# Patient Record
Sex: Female | Born: 1970
Health system: Southern US, Community
[De-identification: ages and names within clinical notes are randomized; demographics above are authoritative.]

## PROBLEM LIST (undated history)

## (undated) DIAGNOSIS — K219 Gastro-esophageal reflux disease without esophagitis: Secondary | ICD-10-CM

## (undated) DIAGNOSIS — I10 Essential (primary) hypertension: Secondary | ICD-10-CM

## (undated) DIAGNOSIS — Z9889 Other specified postprocedural states: Secondary | ICD-10-CM

## (undated) HISTORY — PX: OTHER SURGICAL HISTORY: SHX169

## (undated) HISTORY — PX: BACK SURGERY: SHX140

---

## 1996-10-20 HISTORY — PX: BACK SURGERY: SHX140

## 2014-04-20 ENCOUNTER — Encounter: Payer: Self-pay | Admitting: Podiatrist

## 2014-04-20 ENCOUNTER — Ambulatory Visit (INDEPENDENT_AMBULATORY_CARE_PROVIDER_SITE_OTHER): Payer: BC Managed Care – PPO | Admitting: Podiatrist

## 2014-04-20 ENCOUNTER — Ambulatory Visit: Payer: BC Managed Care – PPO

## 2014-04-20 ENCOUNTER — Ambulatory Visit (INDEPENDENT_AMBULATORY_CARE_PROVIDER_SITE_OTHER): Payer: BC Managed Care – PPO

## 2014-04-20 VITALS — BP 114/61 | HR 89 | Resp 16 | Ht 67.0 in | Wt 175.0 lb

## 2014-04-20 DIAGNOSIS — M779 Enthesopathy, unspecified: Secondary | ICD-10-CM

## 2014-04-20 DIAGNOSIS — M898X9 Other specified disorders of bone, unspecified site: Secondary | ICD-10-CM

## 2014-04-20 NOTE — Patient Instructions (Signed)
Pre-Operative Instructions  Congratulations, you have decided to take an important step to improving your quality of life.  You can be assured that the doctors of [facility] will be with you every step of the way.  1. Plan to be at the surgery center/hospital at least 1 (one) hour prior to your scheduled time unless otherwise directed by the surgical center/hospital staff.  You must have a responsible adult accompany you, remain during the surgery and drive you home.  Make sure you have directions to the surgical center/hospital and know how to get there on time. 2. For hospital based surgery you will need to obtain a history and physical form from your family physician within 1 month prior to the date of surgery- we will give you a form for you primary physician.  3. We make every effort to accommodate the date you request for surgery.  There are however, times where surgery dates or times have to be moved.  We will contact you as soon as possible if a change in schedule is required.   4. No Aspirin/Ibuprofen for one week before surgery.  If you are on aspirin, any non-steroidal anti-inflammatory medications (Mobic, Aleve, Ibuprofen) you should stop taking it 7 days prior to your surgery.  You make take Tylenol  For pain prior to surgery.  5. Medications- If you are taking daily heart and blood pressure medications, seizure, reflux, allergy, asthma, anxiety, pain or diabetes medications, make sure the surgery center/hospital is aware before the day of surgery so they may notify you which medications to take or avoid the day of surgery. 6. No food or drink after midnight the night before surgery unless directed otherwise by surgical center/hospital staff. 7. No alcoholic beverages 24 hours prior to surgery.  No smoking 24 hours prior to or 24 hours after surgery. 8. Wear loose pants or shorts- loose enough to fit over bandages, boots, and casts. 9. No slip on shoes, sneakers are best. 10. Bring  your boot with you to the surgery center/hospital.  Also bring crutches or a walker if your physician has prescribed it for you.  If you do not have this equipment, it will be provided for you after surgery. 11. If you have not been contracted by the surgery center/hospital by the day before your surgery, call to confirm the date and time of your surgery. 12. Leave-time from work may vary depending on the type of surgery you have.  Appropriate arrangements should be made prior to surgery with your employer. 13. Prescriptions will be provided immediately following surgery by your doctor.  Have these filled as soon as possible after surgery and take the medication as directed. 14. Remove nail polish on the operative foot. 15. Wash the night before surgery.  The night before surgery wash the foot and leg well with the antibacterial soap provided and water paying special attention to beneath the toenails and in between the toes.  Rinse thoroughly with water and dry well with a towel.  Perform this wash unless told not to do so by your physician.  Enclosed: 1 Ice pack (please put in freezer the night before surgery)   1 Hibiclens skin cleaner   Pre-op Instructions  If you have any questions regarding the instructions, do not hesitate to call our office.  [place]: [address] [place], [place] [address] [phone]  [place]: [address] [place], [place] [address] [phone]  [place]: [address]  [place], [place] [address] [phone]  Dr. Richard   Tuchman DPM, Dr. Norman Regal DPM Dr. Richard Sikora DPM, Dr. M. Todd Hyatt DPM, Dr. Roshawnda Pecora DPM 

## 2014-04-20 NOTE — Progress Notes (Signed)
   Subjective:    Patient ID: Angela Osborn, female    DOB: 1970-10-28, 43 y.o.   MRN: [id number]  HPI Comments: Its my 2nd toe on my rt foot. It is painful. Its been like this for 2 months. Sometimes it gets worse. In the mornings its worse especially when standing in the shower. i have been taping it, ice, took mobic.  Foot Pain      Review of Systems  All other systems reviewed and are negative.      Objective:   Physical Exam Patient is awake, alert, and oriented x 3.  In no acute distress.  Vascular status is intact with palpable pedal pulses at 2/4 DP and PT bilateral and capillary refill time within normal limits. Neurological sensation is also intact bilaterally via Semmes Weinstein monofilament at 5/5 sites. Light touch, vibratory sensation, Achilles tendon reflex is intact. Dermatological exam reveals skin color, turger and texture as normal. No open lesions present.  Musculature intact with dorsiflexion, plantarflexion, inversion, eversion.  Discrete pain on the medial aspect of the right second toe and plantar second toe is noted. It is increased from the last visit and 2012. X-ray show a exostosis growing on the medial aspect of the right second toe in the area of the pain.     Assessment & Plan:  Bony exostosis medial aspect right second toe  Plan: Recommended excision of the bony spur. We discussed the consent form the patient had ample time to ask questions. We will [facility] on outpatient basis. She'll be seen one week postoperatively. She was dispensed a Darco shoe today.

## 2014-05-03 DIAGNOSIS — M898X9 Other specified disorders of bone, unspecified site: Secondary | ICD-10-CM

## 2014-05-04 ENCOUNTER — Telehealth: Payer: Self-pay

## 2014-05-04 NOTE — Telephone Encounter (Signed)
Spoke with pt regarding post operative status. She stated that she was managing pain effectively. Advised to keep elevated and keep dressing dry and remain in boot until appt next week.

## 2014-05-11 ENCOUNTER — Ambulatory Visit (INDEPENDENT_AMBULATORY_CARE_PROVIDER_SITE_OTHER): Payer: BC Managed Care – PPO | Admitting: Podiatrist

## 2014-05-11 ENCOUNTER — Ambulatory Visit (INDEPENDENT_AMBULATORY_CARE_PROVIDER_SITE_OTHER): Payer: BC Managed Care – PPO

## 2014-05-11 VITALS — BP 123/77 | HR 69 | Resp 16

## 2014-05-11 DIAGNOSIS — Z9889 Other specified postprocedural states: Secondary | ICD-10-CM

## 2014-05-11 DIAGNOSIS — M898X9 Other specified disorders of bone, unspecified site: Secondary | ICD-10-CM

## 2014-05-11 NOTE — Progress Notes (Signed)
Subjective: Patient presents today1 week status post foot surgery of the right foot.  Date of surgery 05-03-2014-  Removal of bone spur on the medial side of the 2nd toe right foot. Patient denies nausea, vomiting, fevers, chills or night sweats.  Denies calf pain or tenderness to the operative side. Relates she is having some nerve pain on the foot and the right great toe.  Relates she has had problems in general with the right foot since her back surgery at 43 years old and she has no feeling on the outside of her leg.  Overall however, she is doing well post operatively  Objective:  Incision sites are well coapted with suture line in place.  No swelling, no redness is noted.  Minimal ecchymosis is noted.  Overall excellent appearance clinically and radiographically is noted.  Neurovascular status is intact with palpable pedal pulses DP and PT bilateral at 2+ out of 4. . Excellent appearance of the postoperative foot is noted.   Assessment: Status post excision of bone spur/ tumor right 2nd toe.  Plan:  Redressed the toe and a dry sterile and compressive dressing. Recommended that she keep it dry until Sunday then she can get the foot wet. I'll see her back next week and we will remove the sutures at that point. If any problems arise in the meantime she is instructed to call.

## 2014-05-18 ENCOUNTER — Ambulatory Visit (INDEPENDENT_AMBULATORY_CARE_PROVIDER_SITE_OTHER): Payer: BC Managed Care – PPO | Admitting: Podiatrist

## 2014-05-18 VITALS — BP 116/67 | HR 83 | Resp 16

## 2014-05-18 DIAGNOSIS — Z9889 Other specified postprocedural states: Secondary | ICD-10-CM

## 2014-05-18 DIAGNOSIS — M898X9 Other specified disorders of bone, unspecified site: Secondary | ICD-10-CM

## 2014-05-19 NOTE — Progress Notes (Signed)
Subjective: Angela Osborn presents today for HER-2 week postop followup status post removal exostosis medial aspect right second toe. She states she still has pain on the bottom of the toe at the base. Otherwise she's doing well.  Objective: Neurovascular status intact and unchanged incision site is well coapted in sutures are removed at today's visit. Slight irritation along the proximal portion of the incision is noted no redness, no streaking or sign of infection is noted. Overall excellent appearance is seen.  Assessment: 2 week status post excision of exostosis second digit right foot  Plan: Sutures are removed at today's visit and redressed with a dry dressing. She may begin to wear a normal shoes that are comfortable. I will see her back in 3 weeks for followup.

## 2014-06-01 ENCOUNTER — Ambulatory Visit (INDEPENDENT_AMBULATORY_CARE_PROVIDER_SITE_OTHER): Payer: BC Managed Care – PPO

## 2014-06-01 ENCOUNTER — Ambulatory Visit (INDEPENDENT_AMBULATORY_CARE_PROVIDER_SITE_OTHER): Payer: BC Managed Care – PPO | Admitting: Podiatrist

## 2014-06-01 VITALS — BP 137/84 | HR 81 | Resp 16

## 2014-06-01 DIAGNOSIS — M7750 Other enthesopathy of unspecified foot: Secondary | ICD-10-CM

## 2014-06-01 DIAGNOSIS — M898X9 Other specified disorders of bone, unspecified site: Secondary | ICD-10-CM

## 2014-06-01 DIAGNOSIS — M7741 Metatarsalgia, right foot: Secondary | ICD-10-CM

## 2014-06-01 DIAGNOSIS — M659 Synovitis and tenosynovitis, unspecified: Secondary | ICD-10-CM

## 2014-06-01 NOTE — Progress Notes (Signed)
Subjective: Angela Osborn presents today for her postoperative followup regarding excision of benign bone tumor medial aspect of the right second toe. She states over well she's doing well however she's continuing to compensate and has pain at the metatarsal area of the right foot. She has been wearing her good tennis shoes   Objective: Neurovascular status intact and unchanged. Incision site is healed. Swelling is still present along the medial aspect of the right second toe. Diffuse pain at the metatarsal heads 2 and 3 right are noted. High arched foot type is also noted  Assessment: Status post foot surgery, metatarsalgia  Plan: Recommended scanning her for orthotics at today's visit as I feel this would help reduce the pressure on the metatarsal heads and give her more comfortable gait. I will see her back for followup of the toe and [facility] in 4 weeks. She will pick up her orthotics when they're ready from the [place] location. She will call if any problems or concerns arise.

## 2014-06-22 ENCOUNTER — Encounter: Payer: Self-pay | Admitting: Podiatrist

## 2014-06-30 ENCOUNTER — Encounter: Payer: Self-pay | Admitting: Podiatrist

## 2014-06-30 ENCOUNTER — Ambulatory Visit (INDEPENDENT_AMBULATORY_CARE_PROVIDER_SITE_OTHER): Payer: BC Managed Care – PPO

## 2014-06-30 ENCOUNTER — Ambulatory Visit (INDEPENDENT_AMBULATORY_CARE_PROVIDER_SITE_OTHER): Payer: BC Managed Care – PPO | Admitting: Podiatrist

## 2014-06-30 VITALS — BP 121/81 | HR 86 | Resp 13

## 2014-06-30 DIAGNOSIS — M898X9 Other specified disorders of bone, unspecified site: Secondary | ICD-10-CM

## 2014-06-30 DIAGNOSIS — Z9889 Other specified postprocedural states: Secondary | ICD-10-CM

## 2014-06-30 NOTE — Patient Instructions (Signed)

## 2014-06-30 NOTE — Progress Notes (Signed)
Right 2nd toe looks good-- right hallux nail etched but OK, dispensed orthotics.   Subjective: Angela Osborn presents today for her postoperative followup regarding excision of benign bone tumor medial aspect of the right second toe. She states over well she's doing well however when she goes up and downstairs she has some pain in the metatarsal region of her right foot.  Her orthotics are being dispensed today and She has been wearing her good tennis shoes   Objective: Neurovascular status intact and unchanged. Incision site is healed. Indeed Swelling is still present along the medial aspect of the right second toe. Diffuse pain at the metatarsal heads 2 and 3 right are noted. High arched foot type is also noted  Orthotics appear to contour to the arch well. Itching on the dorsal aspect of the right hallux is noted likely from the long-term use of nail polish. It does not appear to be infected fungal  Assessment: Status post foot surgery, metatarsalgia   Plan: Orthotics are dispensed at today's visit. Recommended wearing these for at least 2 weeks to get adjusted to them until they're comfortable. I will see her back in 4 weeks for followup if orthotics require adjustment she'll bring them at that time.

## 2014-08-02 ENCOUNTER — Ambulatory Visit (INDEPENDENT_AMBULATORY_CARE_PROVIDER_SITE_OTHER): Payer: BC Managed Care – PPO | Admitting: Podiatrist

## 2014-08-02 ENCOUNTER — Encounter: Payer: Self-pay | Admitting: Podiatrist

## 2014-08-02 VITALS — BP 137/88 | HR 88 | Resp 16

## 2014-08-02 DIAGNOSIS — B351 Tinea unguium: Secondary | ICD-10-CM

## 2014-08-02 MED ORDER — EFINACONAZOLE 10 % EX SOLN: 1.0000 [drp] | CUTANEOUS | Status: DC

## 2014-08-07 NOTE — Progress Notes (Signed)
Subjective: Angela Osborn presents today for followup of right foot surgery in for followup of orthotics. She states her right foot is improving and she's noticed the orthotics are helping as well. She does state however, the left orthotic pad is too far forward and she can't get the foot comfortable on the pad. She wonders if moving the pad back somewhat help.  Objective: Neurovascular status is intact and unchanged. Excellent appearance of the right foot is noted in the incision site on the right foot is healed completely. Swelling is resolved. Overall pain has decreased on the right foot. Orthotic on the left has a small metatarsal pad that is too far forward. It is also too bulky for the left foot.  Status post right foot surgery  Plan: She will bring the orthotics in to the [facility] office on Monday and we will have them sent off to be adjusted. If the pad is still uncomfortable after that we will have it removed completely. As far as postoperatively on the right foot she is discharged. We will see her back in the future as necessary.

## 2014-08-13 DIAGNOSIS — K602 Anal fissure, unspecified: Secondary | ICD-10-CM | POA: Insufficient documentation

## 2014-08-13 DIAGNOSIS — K649 Unspecified hemorrhoids: Secondary | ICD-10-CM | POA: Insufficient documentation

## 2014-08-17 ENCOUNTER — Telehealth: Payer: Self-pay | Admitting: *Deleted

## 2014-08-17 NOTE — Telephone Encounter (Signed)
I'm a patient of Dr. Irving ShowsEgerton.  I had surgery, on the 15th of July and everything is going well with that, on my right foot 2nd toe.  My big toe she started treating it with Jublia.  We thought it had a fungus or something.  I found out last night as I was looking at it that it has come off.  The whole entire toenail, there's probably about a quarter of it holding on.  Give me a call back and let me know what I need to do.  I didn't treat it with the medicine this morning, didn't really see a point in treating it with the medicine.  Anyway, give me a call back.  I'd greatly appreciate it.  Thank you.  I called and informed her she needs to come in for an appointment to see Dr. Irving ShowsEgerton to have the nail removed.  "I came in there a few weeks ago and they took a sample of my nail and sent it off I think.  Did they hear anything about that?"  I told her we did send it off but have not received the results.  "I have a bandaid covering it so it will not catch on to anything.  Who do I need to talk to to schedule an appointment?"  I transferred her to [place].

## 2014-08-18 ENCOUNTER — Ambulatory Visit (INDEPENDENT_AMBULATORY_CARE_PROVIDER_SITE_OTHER): Payer: BC Managed Care – PPO | Admitting: Podiatrist

## 2014-08-18 ENCOUNTER — Encounter: Payer: Self-pay | Admitting: Podiatrist

## 2014-08-18 VITALS — BP 125/82 | HR 79 | Resp 13

## 2014-08-18 DIAGNOSIS — L03031 Cellulitis of right toe: Secondary | ICD-10-CM

## 2014-08-18 DIAGNOSIS — S90121A Contusion of right lesser toe(s) without damage to nail, initial encounter: Secondary | ICD-10-CM

## 2014-08-18 NOTE — Patient Instructions (Addendum)

## 2014-08-22 NOTE — Progress Notes (Signed)
Subjective: Angela Osborn presents today for her right great toenail that has become significantly loose.  She has been using the Jublia on the toenail for fungus and states that it started to become loose.  She states only a small part of the nail is attached and it is uncomfortable.  She also states the orthotics have become more comfortable since adjusting to them more. She would like to hold off on any modification to them at this time.   Objective: Neurovascular status is intact and unchanged.  Right hallux nail is loose at the base.  Only the most lateral part of the nail is still attached.  No redness, swelling or signs of infection.  Right second toe is still looking good after surgery.  Assessment:  Contusion of toenail, mycotic toenail, paronychia  Plan:  Treatment options and alternatives were discussed. Recommended a nonpermanent removal of the injured toenail. Patient agreed. Skin was prepped with alcohol and a local injection of lidocaine and Marcaine plain was infiltrated to anesthetize the toe. The toe was then prepped with Betadine exsanguinated. The nail was then carefully liftted from the nail bed.  It was cleansed well with Betadine and hydrogen peroxide solution. Antibiotic ointment and a dressing was then applied and the patient was given instructions for aftercare.

## 2014-08-25 ENCOUNTER — Encounter: Payer: Self-pay | Admitting: Podiatrist

## 2015-05-09 ENCOUNTER — Ambulatory Visit: Payer: BC Managed Care – PPO | Admitting: Podiatry

## 2015-05-16 ENCOUNTER — Ambulatory Visit: Payer: BC Managed Care – PPO | Admitting: Podiatry

## 2015-05-23 ENCOUNTER — Encounter: Payer: Self-pay | Admitting: Podiatry

## 2015-05-23 ENCOUNTER — Ambulatory Visit (INDEPENDENT_AMBULATORY_CARE_PROVIDER_SITE_OTHER): Payer: BC Managed Care – PPO | Admitting: Podiatry

## 2015-05-23 VITALS — BP 116/75 | HR 72 | Resp 16

## 2015-05-23 DIAGNOSIS — L603 Nail dystrophy: Secondary | ICD-10-CM

## 2015-05-23 NOTE — Progress Notes (Signed)
She presents today concerned over discoloration of her hallux nail plate right foot. She states that not only is a discolored but seems to be raising up from the right distal corner. She denies any trauma to it and states that as he grew back as growing back normally and now has started to appear very similar to as it did prior to surgery.  Objective: Vital signs are stable she's alert and oriented 3. Pulses are palpable. Nail dystrophy distal medial aspect of the hallux right subungual debris and distal onychomycosis.  Assessment: Probable onychodystrophy Rule out onychomycosis.  Plan: Sample of the nail was taken today to be sent for pathologic evaluation.

## 2015-06-11 ENCOUNTER — Ambulatory Visit (INDEPENDENT_AMBULATORY_CARE_PROVIDER_SITE_OTHER): Payer: BC Managed Care – PPO | Admitting: Podiatry

## 2015-06-11 ENCOUNTER — Encounter: Payer: Self-pay | Admitting: Podiatry

## 2015-06-11 VITALS — BP 124/83 | HR 74 | Resp 16

## 2015-06-11 DIAGNOSIS — Z79899 Other long term (current) drug therapy: Secondary | ICD-10-CM | POA: Diagnosis not present

## 2015-06-11 DIAGNOSIS — L603 Nail dystrophy: Secondary | ICD-10-CM

## 2015-06-11 MED ORDER — ITRACONAZOLE 200 MG PO TABS: 1.0000 | ORAL_TABLET | Freq: Every day | ORAL | Status: DC

## 2015-06-11 NOTE — Progress Notes (Signed)
She presents today for a follow-up of her nail dystrophy and for her pathology results. She denies any changes in her past medical history medications allergies surgeries or social history.  Objective: Vital signs are stable she is alert and oriented 44 year old white female in no acute distress. Pulses are palpable bilateral. Nail plates are unchanged. Pathology reports do demonstrate saprophytic fungus.  Assessment: Onychomycosis.  Plan: We started her on Sporanox and a liver profile was performed. Should this come back abnormal I'll notify her immediately. Otherwise I will follow up with her in 1 month for another liver test  Arbutus Ped DPM

## 2015-06-11 NOTE — Patient Instructions (Signed)
Itraconazole capsules and tablets What is this medicine? ITRACONAZOLE (i tra KO na zole) is an antifungal medicine. It is used to treat certain kinds of fungal or yeast infections. This medicine may be used for other purposes; ask your health care provider or pharmacist if you have questions. COMMON BRAND NAME(S): ONMEL, Sporanox What should I tell my health care provider before I take this medicine? They need to know if you have any of these conditions: -heart disease, including angina or heart failure -history of irregular heartbeat -kidney disease or on dialysis -liver disease -lung disease -an unusual or allergic reaction to itraconazole, or other antifungal medicines, foods, dyes or preservatives -pregnant or trying to get pregnant -breast-feeding How should I use this medicine? Take this medicine by mouth with a full glass of water. Follow the directions on the prescription label. Take after eating a full meal. If you have a condition called achlorhydria, are taking H2-receptor antagonists or other gastric acid suppressors, you should take this medicine with a cola beverage. Take your doses at regular intervals. Do not take your medicine more often than directed. Do not stop taking except on your doctor's advice. Talk to your pediatrician regarding the use of this medicine in children. Special care may be needed. Overdosage: If you think you have taken too much of this medicine contact a poison control center or emergency room at once. NOTE: This medicine is only for you. Do not share this medicine with others. What if I miss a dose? If you miss a dose, take it as soon as you can. If it is almost time for your next dose, take only that dose. Do not take double or extra doses. What may interact with this medicine? Do not take this medicine with any of the following medications: -alfuzosin -cisapride -colchicine -ergot alkaloids like dihydroergotamine, ergonovine, ergotamine,  methylergonovine -methadone -nevirapine -pimozide -red yeast rice -sirolimus -some medicines for anxiety or sleep like alprazolam, clorazepate, flurazepam, midazolam, triazolam -some medicines for blood pressure like felodipine and nisoldipine -some medicines for cancer treatment like irinotecan -some medicines for cholesterol like atorvastatin, cerivastatin, lovastatin, simvastatin -some medicines for the heart like conivaptan, disopyramide, dofetilide, dronedarone, eplerenone, quinidine, ranolazine -some medicines for psychotic disturbances like lurasidone This medicine may also interact with the following medications: -alfentanil -antacids -antiviral medicines for HIV or AIDS -budesonide -buspirone -cilostazol -cyclosporine -digoxin -eletriptan -erythromycin -fentanyl -fluticasone -halofantrine -medicines for blood pressure like amlodipine and nifedipine -medicines for stomach problems like cimetidine, famotidine, omeprazole, lansoprazole -medicines for tuberculosis like isoniazid, INH, rifabutin, rifampin, rifapentine -medicines that treat or prevent blood clots like warfarin -methylprednisolone -other medicines for fungal infections -phenytoin, fosphenytoin -some medicines for diabetes -tacrolimus -trimetrexate This list may not describe all possible interactions. Give your health care provider a list of all the medicines, herbs, non-prescription drugs, or dietary supplements you use. Also tell them if you smoke, drink alcohol, or use illegal drugs. Some items may interact with your medicine. What should I watch for while using this medicine? Visit your doctor or health care professional for check ups. Tell your doctor or healthcare professional if your symptoms do not start to get better or if they get worse. Some fungal infections can take many weeks or months of treatment to cure. Avoid medicines for your stomach like antacids, anticholinergics, and acid blockers for at  least 2 hours after taking this medicine. You may get dizzy or blurred/double vision when taking this medicine. Do not drive, use machinery, or do anything that may be dangerous   until you know how the medicine affects you. Do not stand or sit up quickly, especially if you are an older patient. What side effects may I notice from receiving this medicine? Side effects that you should report to your doctor or health care professional as soon as possible: -allergic reactions like skin rash, itching or hives, swelling of the face, lips, or tongue -breathing problems -changes in hearing -cough up mucus -dark urine -fast, irregular heartbeat -general ill feeling or flu-like symptoms -light-colored stools -loss of appetite -nausea, vomiting -pain, tingling, numbness in the hands or feet -redness, blistering, peeling or loosening of the skin, including inside the mouth -right upper belly pain -sudden weight gain -swelling in feet, ankles, or legs -unusually weak or tired -yellowing of the eyes or skin Side effects that usually do not require medical attention (report to your doctor or health care professional if they continue or are bothersome): -blurred/double vision -diarrhea -dizziness -headache -stomach upset or bloating This list may not describe all possible side effects. Call your doctor for medical advice about side effects. You may report side effects to FDA at 1-800-FDA-1088. Where should I keep my medicine? Keep out of the reach of children. Store at room temperature between 15 and 25 degrees C (59 and 77 degrees F). Protect from light and moisture. Throw away any unused medicine after the expiration date. NOTE: This sheet is a summary. It may not cover all possible information. If you have questions about this medicine, talk to your doctor, pharmacist, or health care provider.  2015, Elsevier/[employer] Standard. (2013-04-18 17:02:46)  

## 2015-06-12 LAB — CBC WITH DIFFERENTIAL/PLATELET
BASOS ABS: 0 10*3/uL (ref 0.0–0.2)
Basos: 0 %
EOS (ABSOLUTE): 0.1 10*3/uL (ref 0.0–0.4)
Eos: 2 %
HEMATOCRIT: 36.1 % (ref 34.0–46.6)
Hemoglobin: 12.3 g/dL (ref 11.1–15.9)
IMMATURE GRANS (ABS): 0 10*3/uL (ref 0.0–0.1)
IMMATURE GRANULOCYTES: 0 %
LYMPHS: 42 %
Lymphocytes Absolute: 2.5 10*3/uL (ref 0.7–3.1)
MCH: 29.5 pg (ref 26.6–33.0)
MCHC: 34.1 g/dL (ref 31.5–35.7)
MCV: 87 fL (ref 79–97)
MONOCYTES: 8 %
Monocytes Absolute: 0.5 10*3/uL (ref 0.1–0.9)
Neutrophils Absolute: 2.8 10*3/uL (ref 1.4–7.0)
Neutrophils: 48 %
Platelets: 265 10*3/uL (ref 150–379)
RBC: 4.17 x10E6/uL (ref 3.77–5.28)
RDW: 14.2 % (ref 12.3–15.4)
WBC: 5.8 10*3/uL (ref 3.4–10.8)

## 2015-06-12 LAB — HEPATIC FUNCTION PANEL
ALT: 15 IU/L (ref 0–32)
AST: 18 IU/L (ref 0–40)
Albumin: 4.4 g/dL (ref 3.5–5.5)
Alkaline Phosphatase: 66 IU/L (ref 39–117)
Bilirubin Total: 0.2 mg/dL (ref 0.0–1.2)
Bilirubin, Direct: 0.08 mg/dL (ref 0.00–0.40)
TOTAL PROTEIN: 6.6 g/dL (ref 6.0–8.5)

## 2015-06-14 ENCOUNTER — Telehealth: Payer: Self-pay

## 2015-06-14 NOTE — Telephone Encounter (Signed)
Pt called regarding Rx prescription. Stated that she had not been contacted from [facility]. Called pt back and left vm for her to call the pharmacy and inquire about Rx status.

## 2015-07-10 ENCOUNTER — Telehealth: Payer: Self-pay | Admitting: *Deleted

## 2015-07-10 NOTE — Telephone Encounter (Signed)
Pt asked what she will be coming in for on Wednesday.  I told her lab blood work and to discuss if there were possibly any side effects and to discuss possible out come.

## 2015-07-11 ENCOUNTER — Encounter: Payer: Self-pay | Admitting: Podiatry

## 2015-07-11 ENCOUNTER — Ambulatory Visit (INDEPENDENT_AMBULATORY_CARE_PROVIDER_SITE_OTHER): Payer: BC Managed Care – PPO | Admitting: Podiatry

## 2015-07-11 VITALS — BP 124/88 | HR 82 | Resp 18

## 2015-07-11 DIAGNOSIS — B351 Tinea unguium: Secondary | ICD-10-CM | POA: Diagnosis not present

## 2015-07-11 DIAGNOSIS — Z79899 Other long term (current) drug therapy: Secondary | ICD-10-CM | POA: Diagnosis not present

## 2015-07-11 NOTE — Progress Notes (Signed)
She presents today for follow-up of her onychomycosis. She is currently using itraconazole. She denies fever chills nausea vomiting muscle aches and pains rashes or itching.  Objective: No change in her nail plates as of yet.  Assessment: Long-term use of itraconazole for onychomycosis.  Plan: We requested another liver profile today and will follow-up with her in 3 months. She will call with complications or problems regarding her medication.

## 2015-07-12 ENCOUNTER — Telehealth: Payer: Self-pay | Admitting: *Deleted

## 2015-07-12 LAB — HEPATIC FUNCTION PANEL
ALBUMIN: 4.4 g/dL (ref 3.5–5.5)
ALT: 13 IU/L (ref 0–32)
AST: 14 IU/L (ref 0–40)
Alkaline Phosphatase: 74 IU/L (ref 39–117)
Bilirubin, Direct: 0.07 mg/dL (ref 0.00–0.40)
TOTAL PROTEIN: 6.8 g/dL (ref 6.0–8.5)

## 2015-07-12 NOTE — Telephone Encounter (Addendum)
-----   Message from Elinor Parkinson, [place] sent at 07/12/2015  7:49 AM EDT ----- Blood work looks great.  Levels are unchanged and may continue medication.  Informed pt of Dr. Geryl Rankins orders.

## 2015-10-09 ENCOUNTER — Encounter: Payer: Self-pay | Admitting: Podiatry

## 2015-10-10 ENCOUNTER — Encounter: Payer: Self-pay | Admitting: Podiatry

## 2015-10-10 ENCOUNTER — Ambulatory Visit (INDEPENDENT_AMBULATORY_CARE_PROVIDER_SITE_OTHER): Payer: BC Managed Care – PPO | Admitting: Podiatry

## 2015-10-10 VITALS — BP 133/90 | HR 95 | Resp 16

## 2015-10-10 DIAGNOSIS — Z79899 Other long term (current) drug therapy: Secondary | ICD-10-CM | POA: Diagnosis not present

## 2015-10-10 DIAGNOSIS — B351 Tinea unguium: Secondary | ICD-10-CM

## 2015-10-10 MED ORDER — TERBINAFINE HCL 250 MG PO TABS: 250.0000 mg | ORAL_TABLET | Freq: Every day | ORAL | Status: DC

## 2015-10-10 NOTE — Progress Notes (Signed)
She presents today for follow-up of her Sporanox therapy. She states that it has not affected her nail.  Objective: Vital signs stable she is alert and oriented 3 pulses are palpable. No change in the nail plate yet.  Assessment: Onychomycosis not responding to Sporanox.  Plan: Start her on Lamisil 250 mg tablets 1 by mouth daily 30 will follow up with her in 1 month.

## 2015-11-06 ENCOUNTER — Telehealth: Payer: Self-pay | Admitting: *Deleted

## 2015-11-06 ENCOUNTER — Other Ambulatory Visit: Payer: Self-pay | Admitting: Podiatry

## 2015-11-06 NOTE — Telephone Encounter (Signed)
Our office received a refill request for Terbinafine.  Pt was instructed at last visit to make an appt to be seen after 30 doses of the medication.  Contacted pt and informed her of the refill request and she said she felt it may have been an automative request because she is scheduled to see Dr. Al Corpus prior to refills.  I told pt BRAVO, and to keep her appt.

## 2015-11-14 ENCOUNTER — Ambulatory Visit: Payer: BC Managed Care – PPO | Admitting: Podiatry

## 2015-11-19 ENCOUNTER — Encounter: Payer: Self-pay | Admitting: Podiatry

## 2015-11-19 ENCOUNTER — Ambulatory Visit (INDEPENDENT_AMBULATORY_CARE_PROVIDER_SITE_OTHER): Payer: BC Managed Care – PPO | Admitting: Podiatry

## 2015-11-19 DIAGNOSIS — M549 Dorsalgia, unspecified: Secondary | ICD-10-CM | POA: Insufficient documentation

## 2015-11-19 DIAGNOSIS — B351 Tinea unguium: Secondary | ICD-10-CM | POA: Diagnosis not present

## 2015-11-19 DIAGNOSIS — Z79899 Other long term (current) drug therapy: Secondary | ICD-10-CM | POA: Diagnosis not present

## 2015-11-19 DIAGNOSIS — J309 Allergic rhinitis, unspecified: Secondary | ICD-10-CM | POA: Insufficient documentation

## 2015-11-19 DIAGNOSIS — K219 Gastro-esophageal reflux disease without esophagitis: Secondary | ICD-10-CM | POA: Insufficient documentation

## 2015-11-19 MED ORDER — TERBINAFINE HCL 250 MG PO TABS: 250.0000 mg | ORAL_TABLET | Freq: Every day | ORAL | Status: DC

## 2015-11-19 NOTE — Progress Notes (Signed)
She presents today after taking 30 days of Lamisil. She denies fever chills nausea vomiting muscle aches and pains itching or rashes. She states that is growing very slowly she refers to the nail plate hallux left.  Objective: Vital signs are stable she is alert and oriented 3. Nail plate does not demonstrate any change as of yet. As a matter fact it looks more dystrophic.  Assessment: Long-term therapy with Lamisil secondary to nail dystrophy and onychomycosis.  Plan: Continue another 90 days Lamisil requested another blood work. Follow up with her should this blood work back abnormal. Otherwise I will follow up with her 4 months

## 2015-11-21 ENCOUNTER — Telehealth: Payer: Self-pay | Admitting: *Deleted

## 2015-11-21 LAB — HEPATIC FUNCTION PANEL
ALT: 20 IU/L (ref 0–32)
AST: 17 IU/L (ref 0–40)
Albumin: 4.3 g/dL (ref 3.5–5.5)
Alkaline Phosphatase: 66 IU/L (ref 39–117)
Bilirubin, Direct: 0.07 mg/dL (ref 0.00–0.40)
Total Protein: 6.4 g/dL (ref 6.0–8.5)

## 2015-11-21 NOTE — Telephone Encounter (Addendum)
-----   Message from Elinor Parkinson, [place] sent at 11/21/2015  7:55 AM EST ----- Blood work looks good and may continue medication.  Informed pt of orders.

## 2015-11-27 ENCOUNTER — Ambulatory Visit (INDEPENDENT_AMBULATORY_CARE_PROVIDER_SITE_OTHER): Payer: BC Managed Care – PPO | Admitting: Podiatry

## 2015-11-27 ENCOUNTER — Encounter: Payer: Self-pay | Admitting: Podiatry

## 2015-11-27 ENCOUNTER — Ambulatory Visit (INDEPENDENT_AMBULATORY_CARE_PROVIDER_SITE_OTHER): Payer: BC Managed Care – PPO

## 2015-11-27 VITALS — BP 125/83 | HR 80 | Resp 18

## 2015-11-27 DIAGNOSIS — R52 Pain, unspecified: Secondary | ICD-10-CM

## 2015-11-27 DIAGNOSIS — M795 Residual foreign body in soft tissue: Secondary | ICD-10-CM

## 2015-11-27 DIAGNOSIS — B351 Tinea unguium: Secondary | ICD-10-CM | POA: Diagnosis not present

## 2015-11-27 MED ORDER — CEPHALEXIN 500 MG PO CAPS: 500.0000 mg | ORAL_CAPSULE | Freq: Three times a day (TID) | ORAL | Status: DC

## 2015-11-27 NOTE — Patient Instructions (Signed)

## 2015-11-30 DIAGNOSIS — M795 Residual foreign body in soft tissue: Secondary | ICD-10-CM | POA: Insufficient documentation

## 2015-11-30 DIAGNOSIS — B351 Tinea unguium: Secondary | ICD-10-CM | POA: Insufficient documentation

## 2015-11-30 NOTE — Progress Notes (Signed)
Patient ID: Angela Osborn, female   DOB: 12/06/1970, 45 y.o.   MRN: [id number]  Subjective: 45 year old female presents the office today for concerns of a possible hair in the ball of her left foot which has been ongoing the last couple days. She states that she try to sterilize a needle at home and charted cut the area out however she is unable to do so. She believes that she stepped on a hair while in the bathroom and since then she has had discomfort this area. Denies any swelling redness or drainage but is somewhat swollen. She is currently also on Lamisil for her right big toenail without any side effects. Denies any systemic complaints such as fevers, chills, nausea, vomiting. No acute changes since last appointment, and no other complaints at this time.   Objective: AAO x3, NAD DP/PT pulses palpable bilaterally, CRT less than 3 seconds Protective sensation intact with Simms Weinstein monofilament, vibratory sensation intact, Achilles tendon reflex intact On the plantar aspect of the left submetatarsal area is a pinpoint area of tenderness overlying a possible foreign body. Upon debridement a small black care was removed. Upon further debridement there is resolution of symptoms. There is no drainage or pus. There is mild localized edema to the area without any significant erythema or increase in warmth. There is no drainage or pus. No other areas of tenderness bilateral lower extremities. The right hallux nails hypertrophic, dystrophic, discolored. She is coming on Lamisil. No swelling erythema or drainage. No pain. No areas of pinpoint bony tenderness or pain with vibratory sensation. MMT 5/5, ROM WNL. No edema, erythema, increase in warmth to bilateral lower extremities.  No open lesions or pre-ulcerative lesions.  No pain with calf compression, swelling, warmth, erythema  Assessment: 45 year old female left foot foreign body/hair, onychomycosis right hallux  Plan: -All treatment options  discussed with the patient including all alternatives, risks, complications.  -X-rays were obtained. There is no evidence of foreign body present. No fracture. -Symptomatic area was debrided and a small hair was removed. Upon debridement there is resolution of symptoms. Due to the localized edema will start Keflex in case of early infection. Continue with Epsom salts soaks as well as Neosporin and a Band-Aid. Monitor for any clinical signs or symptoms of infection and directed to call the office immediately should any occur or go to the ER. -Continue Lamisil for right hallux toenail. -Follow-up as scheduled. -Patient encouraged to call the office with any questions, concerns, change in symptoms.   Ovid Curd, DPM

## 2016-03-07 ENCOUNTER — Other Ambulatory Visit: Payer: Self-pay | Admitting: Podiatry

## 2016-03-19 ENCOUNTER — Ambulatory Visit: Payer: BC Managed Care – PPO | Admitting: Podiatry

## 2016-08-25 DIAGNOSIS — Z0000 Encounter for general adult medical examination without abnormal findings: Secondary | ICD-10-CM | POA: Insufficient documentation

## 2016-12-23 DIAGNOSIS — G8929 Other chronic pain: Secondary | ICD-10-CM | POA: Insufficient documentation

## 2016-12-23 DIAGNOSIS — M545 Low back pain: Secondary | ICD-10-CM

## 2017-04-08 NOTE — Progress Notes (Signed)
Tawana ScaleZach Stanzione D.O. [facility] [address] [place], [place] [address] Phone: [phone] Subjective:     CC:  Low back pain.  FAO:[id number]:Subjective  Angela Osborn is a 46 y.o. female coming in with complaint of low back pain. Patient has had this for quite some time. Was seen another provider and had significant workup for this including MRI. Patient also had an epidural that did improve. Patient wants to avoid any type of other epidural but is unfortunately noticing having worsening back pain. Seems to stay localized. Worse with sitting for long amount of time. Denies any radiation down the legs, any numbness or tingling. Patient rates the severity of pain as 5 out of 10 but does have a constant dull aching pain. Sometimes seems to be worse with flexion and sometimes seems to be worse with extension. Sometimes can wake her up at night. Respond somewhat to over-the-counter medications. Denies any fevers, chills, any abnormal weight loss.    Patient has had imaging including MRI of the lumbar spine. MRI taken 01/27/2017 was independently visualized by me. Patient did have a moderate central disc extrusion at L5-S1 with facet arthritis. Otherwise appeared to be fairly unremarkable. No past medical history on file. No past surgical history on file. Social History   Social History  . Marital status: Married    Spouse name: N/A  . Number of children: N/A  . Years of education: N/A   Social History Main Topics  . Smoking status: Never Smoker  . Smokeless tobacco: Not on file  . Alcohol use No  . Drug use: Unknown  . Sexual activity: Not on file   Other Topics Concern  . Not on file   Social History Narrative  . No narrative on file   Allergies  Allergen Reactions  . Ciprofloxacin Nausea Only  . Sulfa Antibiotics Nausea Only   No family history on file.  Past medical history, social, surgical and family history all reviewed in electronic medical record.  No pertanent  information unless stated regarding to the chief complaint.   Review of Systems:Review of systems updated and as accurate as of 04/08/17  No headache, visual changes, nausea, vomiting, diarrhea, constipation, dizziness, abdominal pain, skin rash, fevers, chills, night sweats, weight loss, swollen lymph nodes, body aches, joint swelling,, chest pain, shortness of breath, mood changes.  Positive muscle aches  Objective  There were no vitals taken for this visit. Systems examined below as of 04/08/17   General: No apparent distress alert and oriented x3 mood and affect normal, dressed appropriately.  HEENT: Pupils equal, extraocular movements intact  Respiratory: Patient's speak in full sentences and does not appear short of breath  Cardiovascular: No lower extremity edema, non tender, no erythema  Skin: Warm dry intact with no signs of infection or rash on extremities or on axial skeleton.  Abdomen: Soft nontender  Neuro: Cranial nerves II through XII are intact, neurovascularly intact in all extremities with 2+ DTRs and 2+ pulses.  Lymph: No lymphadenopathy of posterior or anterior cervical chain or axillae bilaterally.  Gait normal with good balance and coordination.  MSK:  Non tender with full range of motion and good stability and symmetric strength and tone of shoulders, elbows, wrist, hip, knee and ankles bilaterally.  Back Exam:  Inspection: Unremarkable  Motion: Flexion 30 deg, Extension 15 deg, Side Bending to 25 deg bilaterally,  Rotation to 25 deg bilaterally  SLR laying: Negative  XSLR laying: Negative  Palpable tenderness: ender to  palpation in the paraspinal mus. FABER: mild tightness bilaterally. Sensory change: Gross sensation intact to all lumbar and sacral dermatomes.  Reflexes: 2+ at both patellar tendons, 2+ at achilles tendons, Babinski's downgoing.  Strength at foot  Plantar-flexion: 5/5 Dorsi-flexion: 5/5 Eversion: 5/5 Inversion: 5/5  Leg strength  Quad: 5/5  Hamstring: 5/5 Hip flexor: 5/5 Hip abductors: 4/5  Gait unremarkable.  Osteopathic findings T3 extended rotated and side bent right  T9 extended rotated and side bent left L2 flexed rotated and side bent right L5 flexed rotated and side bent left Sacrum right on right  Procedure note  97110; 15 minutes spent for Therapeutic exercises as stated in above notes.  This included exercises focusing on stretching, strengthening, with significant focus on eccentric aspects. Low back exercises that included:  Pelvic tilt/bracing instruction to focus on control of the pelvic girdle and lower abdominal muscles  Glute strengthening exercises, focusing on proper firing of the glutes without engaging the low back muscles Proper stretching techniques for maximum relief for the hamstrings, hip flexors, low back and some rotation where tolerated    Proper technique shown and discussed handout in great detail with ATC.  All questions were discussed and answered. \   Impression and Recommendations:     This case required medical decision making of moderate complexity.      Note: This dictation was prepared with Dragon dictation along with smaller phrase technology. Any transcriptional errors that result from this process are unintentional.

## 2017-04-09 ENCOUNTER — Ambulatory Visit (INDEPENDENT_AMBULATORY_CARE_PROVIDER_SITE_OTHER): Payer: BC Managed Care – PPO | Admitting: Family Medicine

## 2017-04-09 DIAGNOSIS — M5136 Other intervertebral disc degeneration, lumbar region: Secondary | ICD-10-CM | POA: Diagnosis not present

## 2017-04-09 DIAGNOSIS — M999 Biomechanical lesion, unspecified: Secondary | ICD-10-CM | POA: Diagnosis not present

## 2017-04-09 DIAGNOSIS — M4696 Unspecified inflammatory spondylopathy, lumbar region: Secondary | ICD-10-CM

## 2017-04-09 DIAGNOSIS — M5137 Other intervertebral disc degeneration, lumbosacral region: Secondary | ICD-10-CM | POA: Insufficient documentation

## 2017-04-09 DIAGNOSIS — M51379 Other intervertebral disc degeneration, lumbosacral region without mention of lumbar back pain or lower extremity pain: Secondary | ICD-10-CM

## 2017-04-09 DIAGNOSIS — M47816 Spondylosis without myelopathy or radiculopathy, lumbar region: Secondary | ICD-10-CM

## 2017-04-09 MED ORDER — VITAMIN D (ERGOCALCIFEROL) 1.25 MG (50000 UNIT) PO CAPS: 50000.0000 [IU] | ORAL_CAPSULE | ORAL | 0 refills | Status: DC

## 2017-04-09 NOTE — Assessment & Plan Note (Signed)
Decision today to treat with OMT was based on Physical Exam  After verbal consent patient was treated with HVLA, ME, FPR techniques in  thoracic, lumbar and sacral areas  Patient tolerated the procedure well with improvement in symptoms  Patient given exercises, stretches and lifestyle modifications  See medications in patient instructions if given  Patient will follow up in 4 weeks 

## 2017-04-09 NOTE — Assessment & Plan Note (Signed)
Facet arthritis noted at L5-S1. Worsening symptoms consider injections. Avoid excessive extension

## 2017-04-09 NOTE — Patient Instructions (Signed)
Good to see you  Ice 20 minutes 2 times daily. Usually after activity and before bed. Exercises 3 times a week.  Once weekly vitamin D for 12 weeks.  Continue your other vitamins Also get  Turmeric 500mg  twice daily  Tart cherry extract any dose at night You will do great  See me again in 4 weeks.

## 2017-04-09 NOTE — Assessment & Plan Note (Signed)
Significant degenerative disc disease of the L5-S1 region. Discussed with patient also about the facet arthritis. Has responded well to the epidural previously. Once to avoid another injection at this time. Started on once weekly vitamin D for muscle strength and endurance. Responded very well to osteopathic manipulation. Home exercises given again we discussed icing regimen. Follow-up again in 4 weeks.

## 2017-05-07 ENCOUNTER — Ambulatory Visit (INDEPENDENT_AMBULATORY_CARE_PROVIDER_SITE_OTHER): Payer: BC Managed Care – PPO | Admitting: Family Medicine

## 2017-05-07 VITALS — BP 124/84 | HR 76 | Ht 67.0 in | Wt 194.0 lb

## 2017-05-07 DIAGNOSIS — M4696 Unspecified inflammatory spondylopathy, lumbar region: Secondary | ICD-10-CM

## 2017-05-07 DIAGNOSIS — M47816 Spondylosis without myelopathy or radiculopathy, lumbar region: Secondary | ICD-10-CM

## 2017-05-07 DIAGNOSIS — M999 Biomechanical lesion, unspecified: Secondary | ICD-10-CM

## 2017-05-07 DIAGNOSIS — S76302A Unspecified injury of muscle, fascia and tendon of the posterior muscle group at thigh level, left thigh, initial encounter: Secondary | ICD-10-CM | POA: Insufficient documentation

## 2017-05-07 NOTE — Assessment & Plan Note (Signed)
Patient does have a hamstring injury. We discussed icing regimen and home exercises. Discussed compression. Follow-up again in 4 weeks

## 2017-05-07 NOTE — Assessment & Plan Note (Signed)
Patient significant arthritis at this level. Patient does not want have another epidural if possible. Maybe consider facet injections. Patient does have a new hamstring injury. We discussed with patient at great length. Patient will try to increase her daily. Follow-up again in 4 weeks

## 2017-05-07 NOTE — Patient Instructions (Signed)
Good to see you.  Ice 20 minutes 2 times daily. Usually after activity and before bed. New exercises 3 times a week  Thigh compression sleeve daily for 2 weeks can help a lot.  See me again in 4-6 weeks

## 2017-05-07 NOTE — Progress Notes (Signed)
Terrilee Files [facility] [address] [place], [place] [address] Phone: [phone] Subjective:     CC:  Low back pain f/u   BJY:[id number]  Angela Osborn is a 46 y.o. female coming in with complaint of low back pain. Patient has had this for quite some time. Was seen another provider and had significant workup for this including MRI. Patient also had an epidural that did improve. Patient wants to avoid any type of other epidural but is unfortunately noticing having worsening back pain.   Patient's only previously weaned and started osteopathic manipulation. Patient has been doing the home exercises. Patient states still having some discomfort. Recently did well. Having more pain in the ischial bursa. States that it seems to be more of a tightness of the left leg. Denies any weakness. States that it continues to give her difficulty though. Unable to work out as much as she would like. Can affect her work..    Patient has had imaging including MRI of the lumbar spine. MRI taken 01/27/2017 was independently visualized by me. Patient did have a moderate central disc extrusion at L5-S1 with facet arthritis. Otherwise appeared to be fairly unremarkable. No past medical history on file. No past surgical history on file. Social History   Social History  . Marital status: Married    Spouse name: N/A  . Number of children: N/A  . Years of education: N/A   Social History Main Topics  . Smoking status: Never Smoker  . Smokeless tobacco: Not on file  . Alcohol use No  . Drug use: Unknown  . Sexual activity: Not on file   Other Topics Concern  . Not on file   Social History Narrative  . No narrative on file   Allergies  Allergen Reactions  . Ciprofloxacin Nausea Only  . Sulfa Antibiotics Nausea Only   No family history on file.  Past medical history, social, surgical and family history all reviewed in electronic medical record.  No pertanent information unless  stated regarding to the chief complaint.   Review of Systems: No headache, visual changes, nausea, vomiting, diarrhea, constipation, dizziness, abdominal pain, skin rash, fevers, chills, night sweats, weight loss, swollen lymph nodes, body aches, joint swelling, muscle aches, chest pain, shortness of breath, mood changes.    Objective  Blood pressure 124/84, pulse 76, height 5\' 7"  (1.702 m), weight 194 lb (88 kg).   Systems examined below as of 05/07/17 General: NAD A&O x3 mood, affect normal  HEENT: Pupils equal, extraocular movements intact no nystagmus Respiratory: not short of breath at rest or with speaking Cardiovascular: No lower extremity edema, non tender Skin: Warm dry intact with no signs of infection or rash on extremities or on axial skeleton. Abdomen: Soft nontender, no masses Neuro: Cranial nerves  intact, neurovascularly intact in all extremities with 2+ DTRs and 2+ pulses. Lymph: No lymphadenopathy appreciated today  Gait normal with good balance and coordination.  MSK: Non tender with full range of motion and good stability and symmetric strength and tone of shoulders, elbows, wrist,  knee hips and ankles bilaterally.   Back Exam:  Inspection: Mild loss of lordosis Motion: Flexion 30 deg, Extension 15 deg, Side Bending to 25 deg bilaterally,  Rotation to 25 deg bilaterally  SLR laying: Negative significant tightness on the left hamstring XSLR laying: Negative  Palpable tenderness: Significant increased tenderness in the lumbosacral area. Sensory change: Gross sensation intact to all lumbar and sacral dermatomes.  Reflexes:  2+ at both patellar tendons, 2+ at achilles tendons, Babinski's downgoing.  Strength at foot  Plantar-flexion: 5/5 Dorsi-flexion: 5/5 Eversion: 5/5 Inversion: 5/5  Leg strength  Quad: 5/5 Hamstring: 5/5 Hip flexor: 5/5 Hip abductors: 4/5  Gait unremarkable.  Osteopathic findings C2 flexed rotated and side bent right C4 flexed rotated and side  bent left C7 flexed rotated and side bent left T3 extended rotated and side bent right inhaled third rib L2 flexed rotated and side bent right Sacrum right on right    Impression and Recommendations:     This case required medical decision making of moderate complexity.      Note: This dictation was prepared with Dragon dictation along with smaller phrase technology. Any transcriptional errors that result from this process are unintentional.

## 2017-05-07 NOTE — Assessment & Plan Note (Signed)
Decision today to treat with OMT was based on Physical Exam  After verbal consent patient was treated with HVLA, ME, FPR techniques in  thoracic, lumbar and sacral areas  Patient tolerated the procedure well with improvement in symptoms  Patient given exercises, stretches and lifestyle modifications  See medications in patient instructions if given  Patient will follow up in 4 weeks 

## 2017-06-10 ENCOUNTER — Ambulatory Visit: Payer: BC Managed Care – PPO | Admitting: Family Medicine

## 2017-06-29 ENCOUNTER — Other Ambulatory Visit: Payer: Self-pay | Admitting: Family Medicine

## 2017-06-29 NOTE — Telephone Encounter (Signed)
Refill done.  

## 2017-09-15 ENCOUNTER — Other Ambulatory Visit: Payer: Self-pay | Admitting: *Deleted

## 2017-09-15 MED ORDER — VITAMIN D (ERGOCALCIFEROL) 1.25 MG (50000 UNIT) PO CAPS: 50000.0000 [IU] | ORAL_CAPSULE | ORAL | 0 refills | Status: DC

## 2017-09-15 NOTE — Telephone Encounter (Signed)
Refill done.  

## 2017-12-07 ENCOUNTER — Telehealth: Payer: Self-pay | Admitting: Family Medicine

## 2017-12-07 ENCOUNTER — Encounter: Payer: Self-pay | Admitting: Family Medicine

## 2017-12-07 NOTE — Telephone Encounter (Signed)
Copied from CRM [phone]. Topic: Quick Communication - See Telephone Encounter >> Dec 07, 2017 12:27 PM Angela Osborn, Rosey Batheresa D wrote: CRM for notification. See Telephone encounter for: 12/07/17. Patient called and would like to know if she can have mailed or faxed that back exercise that Dr. Katrinka BlazingSmith gave her back on 04/09/17 visit. Her fax number is [phone]. Please call patient back, thanks.

## 2017-12-07 NOTE — Telephone Encounter (Signed)
Copy faxed to patient and notification sent to her via MyChart.

## 2017-12-15 ENCOUNTER — Other Ambulatory Visit: Payer: Self-pay | Admitting: Family Medicine

## 2018-01-12 DIAGNOSIS — M2391 Unspecified internal derangement of right knee: Secondary | ICD-10-CM | POA: Insufficient documentation

## 2018-03-23 ENCOUNTER — Other Ambulatory Visit: Payer: Self-pay | Admitting: Family Medicine

## 2018-07-08 ENCOUNTER — Other Ambulatory Visit: Payer: Self-pay | Admitting: Family Medicine

## 2018-07-08 NOTE — Telephone Encounter (Signed)
Refill denied. Pt needs an appt. 

## 2018-07-23 ENCOUNTER — Other Ambulatory Visit: Payer: Self-pay | Admitting: Obstetrics and Gynecology

## 2018-07-23 DIAGNOSIS — Z1231 Encounter for screening mammogram for malignant neoplasm of breast: Secondary | ICD-10-CM

## 2018-08-06 ENCOUNTER — Encounter: Payer: Self-pay | Admitting: Radiology

## 2018-08-06 ENCOUNTER — Ambulatory Visit
Admission: RE | Admit: 2018-08-06 | Discharge: 2018-08-06 | Disposition: A | Discharge status: Home / Self Care | Payer: 59 | Source: Ambulatory Visit | Attending: Obstetrics and Gynecology | Admitting: Obstetrics and Gynecology

## 2018-08-06 DIAGNOSIS — Z1231 Encounter for screening mammogram for malignant neoplasm of breast: Secondary | ICD-10-CM | POA: Insufficient documentation

## 2018-08-27 ENCOUNTER — Ambulatory Visit (INDEPENDENT_AMBULATORY_CARE_PROVIDER_SITE_OTHER): Payer: Self-pay | Admitting: Physician Assistant

## 2018-08-27 ENCOUNTER — Encounter: Payer: Self-pay | Admitting: Physician Assistant

## 2018-08-27 VITALS — BP 150/110 | HR 87 | Temp 98.1°F | Wt 204.0 lb

## 2018-08-27 DIAGNOSIS — H6982 Other specified disorders of Eustachian tube, left ear: Secondary | ICD-10-CM

## 2018-08-27 MED ORDER — MECLIZINE HCL 25 MG PO TABS: 25.0000 mg | ORAL_TABLET | Freq: Three times a day (TID) | ORAL | 0 refills | Status: DC | PRN

## 2018-08-27 MED ORDER — PREDNISONE 50 MG PO TABS: 50.0000 mg | ORAL_TABLET | Freq: Every day | ORAL | 0 refills | Status: AC

## 2018-08-27 NOTE — Patient Instructions (Signed)
Thank you for choosing [facility] for your health care needs.  You have been diagnosed with eustachian tube dysfunction.  Recommend you continue Flonase nasal spray. Continue Azelastine nasal spray. Continue Loratadine (Claritin).  You have been prescribed prednisone, a steroid. Take 1 pill once a day x 5 days. Take with food to prevent stomach upset. May use OTC Pepcid (famotidine) if you develop heartburn or stomach discomfort.  If no improvement with fluid in ear sensation, follow-up with ENT (ear, nose, and throat physician) for further evaluation and treatment.  Eustachian Tube Dysfunction The eustachian tube connects the middle ear to the back of the nose. It regulates air pressure in the middle ear by allowing air to move between the ear and nose. It also helps to drain fluid from the middle ear space. When the eustachian tube does not function properly, air pressure, fluid, or both can build up in the middle ear. Eustachian tube dysfunction can affect one or both ears. What are the causes? This condition happens when the eustachian tube becomes blocked or cannot open normally. This may result from:  Ear infections.  Colds and other upper respiratory infections.  Allergies.  Irritation, such as from cigarette smoke or acid from the stomach coming up into the esophagus (gastroesophageal reflux).  Sudden changes in air pressure, such as from descending in an airplane.  Abnormal growths in the nose or throat, such as nasal polyps, tumors, or enlarged tissue at the back of the throat (adenoids).  What increases the risk? This condition may be more likely to develop in people who smoke and people who are overweight. Eustachian tube dysfunction may also be more likely to develop in children, especially children who have:  Certain birth defects of the mouth, such as cleft palate.  Large tonsils and adenoids.  What are the signs or symptoms? Symptoms of this condition may  include:  A feeling of fullness in the ear.  Ear pain.  Clicking or popping noises in the ear.  Ringing in the ear.  Hearing loss.  Loss of balance.  Symptoms may get worse when the air pressure around you changes, such as when you travel to an area of high elevation or fly on an airplane. How is this diagnosed? This condition may be diagnosed based on:  Your symptoms.  A physical exam of your ear, nose, and throat.  Tests, such as those that measure: ? The movement of your eardrum (tympanogram). ? Your hearing (audiometry).  How is this treated? Treatment depends on the cause and severity of your condition. If your symptoms are mild, you may be able to relieve your symptoms by moving air into ("popping") your ears. If you have symptoms of fluid in your ears, treatment may include:  Decongestants.  Antihistamines.  Nasal sprays or ear drops that contain medicines that reduce swelling (steroids).  In some cases, you may need to have a procedure to drain the fluid in your eardrum (myringotomy). In this procedure, a small tube is placed in the eardrum to:  Drain the fluid.  Restore the air in the middle ear space.  Follow these instructions at home:  Take over-the-counter and prescription medicines only as told by your health care provider.  Use techniques to help pop your ears as recommended by your health care provider. These may include: ? Chewing gum. ? Yawning. ? Frequent, forceful swallowing. ? Closing your mouth, holding your nose closed, and gently blowing as if you are trying to blow air out of your  nose.  Do not do any of the following until your health care provider approves: ? Travel to high altitudes. ? Fly in airplanes. ? Work in a Estate agent or room. ? Scuba dive.  Keep your ears dry. Dry your ears completely after showering or bathing.  Do not smoke.  Keep all follow-up visits as told by your health care provider. This is  important. Contact a health care provider if:  Your symptoms do not go away after treatment.  Your symptoms come back after treatment.  You are unable to pop your ears.  You have: ? A fever. ? Pain in your ear. ? Pain in your head or neck. ? Fluid draining from your ear.  Your hearing suddenly changes.  You become very dizzy.  You lose your balance. This information is not intended to replace advice given to you by your health care provider. Make sure you discuss any questions you have with your health care provider. Document Released: 11/02/2015 Document Revised: 03/13/2016 Document Reviewed: 10/25/2014 Elsevier Interactive Patient Education  [employer].

## 2018-08-27 NOTE — Progress Notes (Signed)
Patient ID: [name] DOB: 10/06/1971 AGE: 47 y.o. MRN: [id number]   PCP: Albertina Senegal, MD   Chief Complaint:  Chief Complaint  Patient presents with  . left ear pain     Subjective:    HPI:  Angela Osborn is a 47 y.o. female presents for evaluation  Chief Complaint  Patient presents with  . left ear pain    47 year old female presents to [facility] with one week history of fluid sensation in ears. Left worse than right. Associated post-nasal drip. Persistent; not improving or worsening. Constant. Associated imbalance/dizziness; very mild, only present when turning head quickly. Has been using loratadine, flonase nasal spray, and azelastine nasal spray. Patient also used hair dryer and cotton balls in ears with no improvement. Denies fever, chills, headache, ear discharge/drainage, tinnitus, ear pain, sinus pain, nasal congestion (states nose feels dry), sore throat, cough, chest pain, SOB, wheezing, nausea/vomiting.  Patient has taken oral prednisone previously with no adverse effect. Most recently took a course of prednisone for lumbar radiculopathy.  A complete, at least 10 system review of symptoms was performed, pertinent positives and negatives as mentioned in HPI, otherwise negative.  The following portions of the patient's history were reviewed and updated as appropriate: allergies, current medications and past medical history.  Patient Active Problem List   Diagnosis Date Noted  . Hamstring injury, left, initial encounter 05/07/2017  . Degenerative disc disease at L5-S1 level 04/09/2017  . Arthritis of facet joint of lumbar spine 04/09/2017  . Nonallopathic lesion of lumbosacral region 04/09/2017  . Nonallopathic lesion of thoracic region 04/09/2017  . Nonallopathic lesion of sacral region 04/09/2017  . Residual foreign body in soft tissue 11/30/2015  . Onychomycosis 11/30/2015  . Allergic rhinitis 11/19/2015  . Back ache 11/19/2015  . Acid  reflux 11/19/2015  . Anal fissure 08/13/2014  . Hemorrhoid 08/13/2014    Allergies  Allergen Reactions  . Ciprofloxacin Nausea Only  . Sulfa Antibiotics Nausea Only    Current Outpatient Medications on File Prior to Visit  Medication Sig Dispense Refill  . Ascorbic Acid (VITAMIN C) 1000 MG tablet Take 1,000 mg by mouth daily.    Marland Kitchen CALCIUM-MAGNESIUM-ZINC PO Take by mouth.    . cephALEXin (KEFLEX) 500 MG capsule Take 1 capsule (500 mg total) by mouth 3 (three) times daily. 30 capsule 0  . IBUPROFEN PO Take by mouth as needed.    . Misc Natural Products (TART CHERRY ADVANCED PO) Take by mouth.    . Multiple Vitamin (MULTI-VITAMINS) TABS Take by mouth.    . Probiotic Product (PROBIOTIC-10 PO) Take by mouth.    . terbinafine (LAMISIL) 250 MG tablet Take 1 tablet (250 mg total) by mouth daily. 90 tablet 0  . Turmeric 500 MG CAPS Take by mouth.    . Vitamin D, Ergocalciferol, (DRISDOL) 50000 units CAPS capsule TAKE 1 CAPSULE (50,000 UNITS TOTAL) BY MOUTH EVERY 7 (SEVEN) DAYS. 12 capsule 0   No current facility-administered medications on file prior to visit.        Objective:   Vitals:   08/27/18 1011  BP: (!) 150/110  Pulse: 87  Temp: 98.1 F (36.7 C)  SpO2: 99%     Wt Readings from Last 3 Encounters:  08/27/18 204 lb (92.5 kg)  05/07/17 194 lb (88 kg)  04/09/17 190 lb (86.2 kg)    Physical Exam:   General Appearance:  Alert, cooperative, appears stated age. In no acute distress. Afebrile.  Head:  Normocephalic,  without obvious abnormality, atraumatic  Eyes:  PERRL, conjunctiva/corneas clear, EOM's intact, fundi benign, both eyes. No nystagmus.  Ears:  Ear canals WNL bilaterally. Right TM WNL; pearly in color, no serous effusion, no injection, no erythema. Left TM WNL; pearly in color, no visible serous effusion (bulging or bubbles), no injection, no erythema. No purulent drainage in ear canal.  Nose: Nares normal, septum midline. No discharge. Normal mucosa. No sinus  tenderness with percussion/palpation.  Throat: Lips, mucosa, and tongue normal; teeth and gums normal. Throat reveals no erythema. Tonsils with no enlargement or exudate.  Neck: Supple, symmetrical, trachea midline, no adenopathy;  thyroid: not enlarged, symmetric, no tenderness/mass/nodules; no carotid bruit or JVD  Back:   Symmetric, no curvature, ROM normal, no CVA tenderness  Lungs:   Clear to auscultation bilaterally, respirations unlabored  Heart:  Regular rate and rhythm, S1 and S2 normal, no murmur, rub, or gallop  Abdomen:   Soft, non-tender, bowel sounds active all four quadrants,  no masses, no organomegaly  Extremities: Extremities normal, atraumatic, no cyanosis or edema  Pulses: 2+ and symmetric  Skin: Skin color, texture, turgor normal, no rashes or lesions  Lymph nodes: Cervical, supraclavicular, and axillary nodes normal  Neurologic: Normal    Assessment & Plan:    Exam findings, diagnosis etiology and medication use and indications reviewed with patient. Follow-Up and discharge instructions provided. No emergent/urgent issues found on exam.  Patient education was provided.   Patient verbalized understanding of information provided and agrees with plan of care (POC), all questions answered. The patient is advised to call or return to clinic if condition does not see an improvement in symptoms, or to seek the care of the closest emergency department if condition worsens with the below plan.   1. Eustachian tube dysfunction, left  - predniSONE (DELTASONE) 50 MG tablet; Take 1 tablet (50 mg total) by mouth daily with breakfast for 5 days.  Dispense: 5 tablet; Refill: 0 - meclizine (ANTIVERT) 25 MG tablet; Take 1 tablet (25 mg total) by mouth 3 (three) times daily as needed for dizziness.  Dispense: 30 tablet; Refill: 0  Patient with one week history of fluid sensation in left ear. Suspect serous effusion/eustachian tube dysfunction. Advised continuation of flonase nasal  spray, azelastine nasal spray, and Claritin. Prescribed 5-day course of prednisone and Meclizine (for dizziness). Patient advised to f/u with ENT if no improvement within the next few days.  Also discussed with patient her elevated BP. No previous HTN diagnosis. Patient's BP, in Epic, has been gradually worsening over past few years (126/74 on 05/12/2017 to 142/82 on 05/19/18). Patient denies taking decongestants recently. Patient has Meloxicam; takes 1-2 times per week for past several months. Patient states she has appointment with her PCP next month, will discuss with Dr. Albertina Senegal at that time.   Angela Osborn, MHS, PA-C Angela Osborn, MHS, PA-C Advanced Practice Provider [facility]  [address], [facility], [address]st [address] [place], [place] [address] (p):  [phone] [email or web address]

## 2018-08-31 ENCOUNTER — Encounter: Payer: Self-pay | Admitting: Physician Assistant

## 2018-08-31 ENCOUNTER — Ambulatory Visit (INDEPENDENT_AMBULATORY_CARE_PROVIDER_SITE_OTHER): Payer: Self-pay | Admitting: Physician Assistant

## 2018-08-31 VITALS — BP 140/100 | HR 86 | Temp 98.1°F | Wt 206.0 lb

## 2018-08-31 DIAGNOSIS — J0100 Acute maxillary sinusitis, unspecified: Secondary | ICD-10-CM

## 2018-08-31 MED ORDER — AMOXICILLIN-POT CLAVULANATE 875-125 MG PO TABS: 1.0000 | ORAL_TABLET | Freq: Two times a day (BID) | ORAL | 0 refills | Status: AC

## 2018-08-31 NOTE — Progress Notes (Signed)
Patient ID: Angela Osborn DOB: 1971/01/15 AGE: 47 y.o. MRN: [id number]   PCP: Albertina Senegal, MD   Chief Complaint:  Chief Complaint  Patient presents with  . choice-fol-up facial pressure     Subjective:    HPI:  Angela Osborn is a 46 y.o. female presents for evaluation  Chief Complaint  Patient presents with  . choice-fol-up facial pressure    47 year old female returns to [facility] in regards to sinus symptoms. Patient originally seen on Friday 08/27/2018 with one week history of bilateral serous effusion. Associated post-nasal drip. Was prescribed prednisone taper.  Patient returns today four days later with improvement in ear symptoms; however, has developed bilateral temporal and maxillary sinus pressure/congestion. Associated pain/tenderness with palpation. Sinus pain radiates to upper teeth. Reports associated pressure behind eyes. Unable to blow discharge; nose feels dry, occasionally blood streaked rhinorrhea. Has been using Flonase nasal spray and OTC decongestant with minimal improvement. Continues to have mild imbalance/dizziness sensation, when moving head quickly. Denies fever, chills, ear pain, sore throat, cough, chest pain, SOB, wheezing.  A complete, at least 10 system review of symptoms was performed, pertinent positives and negatives as mentioned in HPI, otherwise negative.  The following portions of the patient's history were reviewed and updated as appropriate: allergies, current medications and past medical history.  Patient Active Problem List   Diagnosis Date Noted  . Hamstring injury, left, initial encounter 05/07/2017  . Degenerative disc disease at L5-S1 level 04/09/2017  . Arthritis of facet joint of lumbar spine 04/09/2017  . Nonallopathic lesion of lumbosacral region 04/09/2017  . Nonallopathic lesion of thoracic region 04/09/2017  . Nonallopathic lesion of sacral region 04/09/2017  . Residual foreign body in soft tissue  11/30/2015  . Onychomycosis 11/30/2015  . Allergic rhinitis 11/19/2015  . Back ache 11/19/2015  . Acid reflux 11/19/2015  . Anal fissure 08/13/2014  . Hemorrhoid 08/13/2014    Allergies  Allergen Reactions  . Ciprofloxacin Nausea Only  . Sulfa Antibiotics Nausea Only    Current Outpatient Medications on File Prior to Visit  Medication Sig Dispense Refill  . Ascorbic Acid (VITAMIN C) 1000 MG tablet Take 1,000 mg by mouth daily.    Marland Kitchen azelastine (ASTELIN) 0.1 % nasal spray USE 2 SPRAYS IN EACH NOSTRIL TWICE A DAY    . CALCIUM-MAGNESIUM-ZINC PO Take by mouth.    . fluticasone (FLONASE) 50 MCG/ACT nasal spray SQUIRT 2 SPRAYS EACH NOSTRIL ONCE A DAY    . IBUPROFEN PO Take by mouth as needed.    . Misc Natural Products (TART CHERRY ADVANCED PO) Take by mouth.    . Multiple Vitamin (MULTI-VITAMINS) TABS Take by mouth.    Marland Kitchen omeprazole (PRILOSEC) 40 MG capsule Take by mouth.    . predniSONE (DELTASONE) 50 MG tablet Take 1 tablet (50 mg total) by mouth daily with breakfast for 5 days. 5 tablet 0  . Probiotic Product (PROBIOTIC-10 PO) Take by mouth.    . Turmeric 500 MG CAPS Take by mouth.    . Vitamin D, Ergocalciferol, (DRISDOL) 50000 units CAPS capsule TAKE 1 CAPSULE (50,000 UNITS TOTAL) BY MOUTH EVERY 7 (SEVEN) DAYS. 12 capsule 0  . meclizine (ANTIVERT) 25 MG tablet Take 1 tablet (25 mg total) by mouth 3 (three) times daily as needed for dizziness. 30 tablet 0  . meloxicam (MOBIC) 15 MG tablet TAKE 1 TABLET BY MOUTH DAILY AFTER A MEAL  1   No current facility-administered medications on file prior to visit.  Objective:   Vitals:   08/31/18 1301  BP: (!) 140/100  Pulse: 86  Temp: 98.1 F (36.7 C)  SpO2: 99%     Wt Readings from Last 3 Encounters:  08/31/18 206 lb (93.4 kg)  08/27/18 204 lb (92.5 kg)  05/07/17 194 lb (88 kg)    Physical Exam:   General Appearance:  Alert, cooperative, appears stated age. In no acute distress. Afebrile.  Head:  Normocephalic,  without obvious abnormality, atraumatic  Eyes:  PERRL, conjunctiva/corneas clear, EOM's intact, fundi benign, both eyes  Ears:  Normal TM's and external ear canals, both ears. No erythema or injection.  Nose: Nares normal, septum midline. No discharge. Normal mucosa. No active bleeding. Tenderness with palpation over bilateral maxillary sinuses.  Throat: Lips, mucosa, and tongue normal; teeth and gums normal. Throat reveals no erythema. Mild postnasal drip. Tonsils with no enlargement or exudate.  Neck: Supple, symmetrical, trachea midline, no adenopathy;  thyroid: not enlarged, symmetric, no tenderness/mass/nodules; no carotid bruit or JVD  Back:   Symmetric, no curvature, ROM normal, no CVA tenderness  Lungs:   Clear to auscultation bilaterally, respirations unlabored  Heart:  Regular rate and rhythm, S1 and S2 normal, no murmur, rub, or gallop  Abdomen:   Soft, non-tender, bowel sounds active all four quadrants,  no masses, no organomegaly  Extremities: Extremities normal, atraumatic, no cyanosis or edema  Pulses: 2+ and symmetric  Skin: Skin color, texture, turgor normal, no rashes or lesions  Lymph nodes: Cervical, supraclavicular, and axillary nodes normal  Neurologic: Normal    Assessment & Plan:    Exam findings, diagnosis etiology and medication use and indications reviewed with patient. Follow-Up and discharge instructions provided. No emergent/urgent issues found on exam.  Patient education was provided.   Patient verbalized understanding of information provided and agrees with plan of care (POC), all questions answered. The patient is advised to call or return to clinic if condition does not see an improvement in symptoms, or to seek the care of the closest emergency department if condition worsens with the below plan.   1. Acute non-recurrent maxillary sinusitis  - amoxicillin-clavulanate (AUGMENTIN) 875-125 MG tablet; Take 1 tablet by mouth 2 (two) times daily for 10 days.   Dispense: 20 tablet; Refill: 0  Patient with 1-1/2 week history of rhinorrhea, nasal congestion and bilateral ear fullness/pressure with new onset of maxillary sinus pain. Failure to resolve with prednisone. At this time, will prescribe patient antibiotic, Augmentin, for sinusitis. Advised discontinuation of Flonase due to nasal dryness and epistaxis (blood streaked nasal discharge). Advised continuation of saline nasal spray/Netti Pot. Patient should f/u with PCP in a few days if not improving.   Janalyn Harder, MHS, PA-C Rulon Sera, MHS, PA-C Advanced Practice Provider [facility]  [address], [facility], [address]st [address] [place], [place] [address] (p):  [phone] [email or web address]

## 2018-08-31 NOTE — Patient Instructions (Addendum)
Thank you for choosing [facility] for your health care needs.  You have been diagnosed with sinusitis.  You have been prescribed an antibiotic, Augmentin. Take as prescribed. Take with food to prevent stomach upset. May eat yogurt or take an over the counter probiotic while on antibiotic.  Finish prednisone course. Continue to increase fluids. Continue saline nasal spray.  Discontinue/stop using Flonase nasal spray, may be causing bloody discharge in nose.  Follow-up with family physician in a few days if symptoms not improving.  Hope you feel better soon.  Sinusitis, Adult Sinusitis is soreness and inflammation of your sinuses. Sinuses are hollow spaces in the bones around your face. They are located:  Around your eyes.  In the middle of your forehead.  Behind your nose.  In your cheekbones.  Your sinuses and nasal passages are lined with a stringy fluid (mucus). Mucus normally drains out of your sinuses. When your nasal tissues get inflamed or swollen, the mucus can get trapped or blocked so air cannot flow through your sinuses. This lets bacteria, viruses, and funguses grow, and that leads to infection. Follow these instructions at home: Medicines  Take, use, or apply over-the-counter and prescription medicines only as told by your doctor. These may include nasal sprays.  If you were prescribed an antibiotic medicine, take it as told by your doctor. Do not stop taking the antibiotic even if you start to feel better. Hydrate and Humidify  Drink enough water to keep your pee (urine) clear or pale yellow.  Use a cool mist humidifier to keep the humidity level in your home above 50%.  Breathe in steam for 10-15 minutes, 3-4 times a day or as told by your doctor. You can do this in the bathroom while a hot shower is running.  Try not to spend time in cool or dry air. Rest  Rest as much as possible.  Sleep with your head raised (elevated).  Make sure to get enough sleep  each night. General instructions  Put a warm, moist washcloth on your face 3-4 times a day or as told by your doctor. This will help with discomfort.  Wash your hands often with soap and water. If there is no soap and water, use hand sanitizer.  Do not smoke. Avoid being around people who are smoking (secondhand smoke).  Keep all follow-up visits as told by your doctor. This is important. Contact a doctor if:  You have a fever.  Your symptoms get worse.  Your symptoms do not get better within 10 days. Get help right away if:  You have a very bad headache.  You cannot stop throwing up (vomiting).  You have pain or swelling around your face or eyes.  You have trouble seeing.  You feel confused.  Your neck is stiff.  You have trouble breathing. This information is not intended to replace advice given to you by your health care provider. Make sure you discuss any questions you have with your health care provider. Document Released: 03/24/2008 Document Revised: 06/01/2016 Document Reviewed: 08/01/2015 Elsevier Interactive Patient Education  [employer]2018 [employer]

## 2018-09-02 ENCOUNTER — Telehealth: Payer: Self-pay | Admitting: Emergency Medicine

## 2018-09-02 NOTE — Telephone Encounter (Signed)
Left message follow up call from [facility] visit 

## 2018-09-08 DIAGNOSIS — M5442 Lumbago with sciatica, left side: Secondary | ICD-10-CM | POA: Diagnosis not present

## 2018-10-02 DIAGNOSIS — Z0000 Encounter for general adult medical examination without abnormal findings: Secondary | ICD-10-CM | POA: Diagnosis not present

## 2018-10-06 DIAGNOSIS — M5442 Lumbago with sciatica, left side: Secondary | ICD-10-CM | POA: Diagnosis not present

## 2018-10-06 DIAGNOSIS — K219 Gastro-esophageal reflux disease without esophagitis: Secondary | ICD-10-CM | POA: Diagnosis not present

## 2018-10-06 DIAGNOSIS — G8929 Other chronic pain: Secondary | ICD-10-CM | POA: Diagnosis not present

## 2018-10-06 DIAGNOSIS — Z0000 Encounter for general adult medical examination without abnormal findings: Secondary | ICD-10-CM | POA: Diagnosis not present

## 2018-12-24 ENCOUNTER — Ambulatory Visit (INDEPENDENT_AMBULATORY_CARE_PROVIDER_SITE_OTHER): Payer: Self-pay | Admitting: Physician Assistant

## 2018-12-24 VITALS — BP 130/100 | HR 93 | Temp 98.1°F | Resp 16 | Wt 203.0 lb

## 2018-12-24 DIAGNOSIS — R059 Cough, unspecified: Secondary | ICD-10-CM

## 2018-12-24 DIAGNOSIS — J069 Acute upper respiratory infection, unspecified: Secondary | ICD-10-CM

## 2018-12-24 DIAGNOSIS — R05 Cough: Secondary | ICD-10-CM

## 2018-12-24 DIAGNOSIS — J029 Acute pharyngitis, unspecified: Secondary | ICD-10-CM

## 2018-12-24 LAB — POCT RAPID STREP A (OFFICE): Rapid Strep A Screen: NEGATIVE

## 2018-12-24 MED ORDER — BENZONATATE 100 MG PO CAPS: 200.0000 mg | ORAL_CAPSULE | Freq: Three times a day (TID) | ORAL | 0 refills | Status: DC

## 2018-12-24 MED ORDER — MAGIC MOUTHWASH W/LIDOCAINE: 5.0000 mL | Freq: Four times a day (QID) | ORAL | 0 refills | Status: DC | PRN

## 2018-12-24 NOTE — Progress Notes (Signed)
Patient ID: Angela Osborn DOB: October 14, 1971 AGE: 48 y.o. MRN: [id number]   PCP: Albertina Senegal, MD   Chief Complaint:  Chief Complaint  Patient presents with  . Ear fullness    1d  . Sore Throat    1d     Subjective:    HPI:  Angela Osborn is a 48 y.o. female presents for evaluation  Chief Complaint  Patient presents with  . Ear fullness    1d  . Sore Throat    38d    48 year old female presents to [facility] with two day history of URI symptoms. Began yesterday. Reports sore throat, PND, bilateral ear fullness/pressure, nasal congestion, and cough. Cough dry. Feels triggered by dry throat/sternal irritation. Sore throat better today than yesterday. Has been gargling with Benadryl and took a Benadryl tablet last night. Patient uses Claritin, Astelin nasal spray and Flonase nasal spray daily for seasonal allergies. Denies fever, chills, sweats, body aches, malaise, fatigue, ear discharge/drainage, tinnitus, maxillary sinus pain/pressure, chest pain, SOB, wheezing, nausea/vomiting.   Patient on Tuesday 12/21/2018, three days ago, underwent periodontal surgery. Had exposed nerve (lower incisors) covered with skin graft taken from the left upper gum/palate area. Performed with local anesthesia. No pre or post surgical antibiotic prescribed. Patient scheduled for follow-up appointment and suture removal on Tuesday 12/28/2018, one week from surgery. Patient states first day post-op had scant seepage and bleeding from oral wound, has since resolved. Patient on Naproxen and Tramadol for post-op pain; patient states she has temporal headache and diffuse dental pain (however, was told by oral surgeon was normal and to be expected).  A limited review of symptoms was performed, pertinent positives and negatives as mentioned in HPI.  The following portions of the patient's history were reviewed and updated as appropriate: allergies, current medications and past medical  history.  Patient Active Problem List   Diagnosis Date Noted  . Hamstring injury, left, initial encounter 05/07/2017  . Degenerative disc disease at L5-S1 level 04/09/2017  . Arthritis of facet joint of lumbar spine 04/09/2017  . Nonallopathic lesion of lumbosacral region 04/09/2017  . Nonallopathic lesion of thoracic region 04/09/2017  . Nonallopathic lesion of sacral region 04/09/2017  . Residual foreign body in soft tissue 11/30/2015  . Onychomycosis 11/30/2015  . Allergic rhinitis 11/19/2015  . Back ache 11/19/2015  . Acid reflux 11/19/2015  . Anal fissure 08/13/2014  . Hemorrhoid 08/13/2014    Allergies  Allergen Reactions  . Ciprofloxacin Nausea Only  . Sulfa Antibiotics Nausea Only    Current Outpatient Medications on File Prior to Visit  Medication Sig Dispense Refill  . Ascorbic Acid (VITAMIN C) 1000 MG tablet Take 1,000 mg by mouth daily.    Marland Kitchen azelastine (ASTELIN) 0.1 % nasal spray USE 2 SPRAYS IN EACH NOSTRIL TWICE A DAY    . CALCIUM-MAGNESIUM-ZINC PO Take by mouth.    . fluticasone (FLONASE) 50 MCG/ACT nasal spray SQUIRT 2 SPRAYS EACH NOSTRIL ONCE A DAY    . IBUPROFEN PO Take by mouth as needed.    . meloxicam (MOBIC) 15 MG tablet TAKE 1 TABLET BY MOUTH DAILY AFTER A MEAL  1  . Misc Natural Products (TART CHERRY ADVANCED PO) Take by mouth.    . Multiple Vitamin (MULTI-VITAMINS) TABS Take by mouth.    . naproxen sodium (ANAPROX) 550 MG tablet     . omeprazole (PRILOSEC) 40 MG capsule Take by mouth.    . Probiotic Product (PROBIOTIC-10 PO) Take by mouth.    Marland Kitchen  tiZANidine (ZANAFLEX) 4 MG tablet 1 hs    . Turmeric 500 MG CAPS Take by mouth.    . Vitamin D, Ergocalciferol, (DRISDOL) 50000 units CAPS capsule TAKE 1 CAPSULE (50,000 UNITS TOTAL) BY MOUTH EVERY 7 (SEVEN) DAYS. (Patient not taking: Reported on 12/24/2018) 12 capsule 0   No current facility-administered medications on file prior to visit.        Objective:   Vitals:   12/24/18 0930  BP: (!) 130/100   Pulse: 93  Resp: 16  Temp: 98.1 F (36.7 C)  SpO2: 99%     Wt Readings from Last 3 Encounters:  12/24/18 203 lb (92.1 kg)  08/31/18 206 lb (93.4 kg)  08/27/18 204 lb (92.5 kg)    Physical Exam:   General Appearance:  Patient sitting comfortably on examination table. Conversational. Peri Jefferson self-historian. In no acute distress. Afebrile.   Head:  Normocephalic, without obvious abnormality, atraumatic Face normal to inspection. No obvious edema or erythema. No ecchymosis.   Eyes:  PERRL, conjunctiva/corneas clear, EOM's intact  Ears:  Left ear canal WNL. No erythema or edema. No open wound. No visible purulent drainage. No tenderness with palpation over left tragus or with manipulation of left auricle. No visible erythema or edema of left mastoid. No tenderness with palpation over left mastoid. Right ear canal WNL. No erythema or edema. No open wound. No visible purulent drainage. No tenderness with palpation over right tragus or with manipulation of right auricle. No visible erythema or edema of right mastoid. No tenderness with palpation over right mastoid. Left TM WNL. Good light reflex. Visible landmarks. No erythema. No injection. No bulging or retraction. No visible perforation. No serous effusion. No visible purulent effusion. No tympanostomy tube. No scar tissue. Right TM WNL. Good light reflex. Visible landmarks. No erythema. No injection. No bulging or retraction. No visible perforation. No serous effusion. No visible purulent effusion. No tympanostomy tube. No scar tissue.  Nose: Nares normal. Septum midline. No visible polyps. Nasal mucosa with minimal edema and turbinate enlargement. Scant clear rhinorrhea. No bleeding. No sinus tenderness with percussion/palpation.  Throat: Lips, mucosa, and tongue normal; teeth and gums normal. 0.5cm x 2cm rectangular surgical wound on inner portion of left upper gum. Healing well. No active bleeding. Wound not fully healed; possible serous  drainage. Second surgical wound on lower incisors, outer portion of gum (placement of graft). Dissolvable sutures visible. Healing well. No edema, bleeding, or purulent drainage. Mild tenderness with palpation. Throat reveals no erythema. No postnasal drip. No visible cobblestoning. Tonsils with no enlargement or exudate. Uvula midline with no edema or erythema. No drooling. No hot potato/muffled voice. No trismus. No stridor.  Neck: Supple, symmetrical, trachea midline, no palpable lymphadenopathy  Lungs:   Clear to auscultation bilaterally, respirations unlabored. Good aeration. No rales, rhonchi, crackles or wheezing. Deep inspiration did not trigger cough. No wheezing with forced expiration.  Heart:  Regular rate and rhythm, S1 and S2 normal, no murmur, rub, or gallop  Extremities: Extremities normal, atraumatic, no cyanosis or edema  Pulses: 2+ and symmetric  Skin: Skin color, texture, turgor normal, no rashes or lesions  Lymph nodes: Cervical, supraclavicular, and axillary nodes normal  Neurologic: Normal    Assessment & Plan:    Exam findings, diagnosis etiology and medication use and indications reviewed with patient. Follow-Up and discharge instructions provided. No emergent/urgent issues found on exam.  Patient education was provided.   Patient verbalized understanding of information provided and agrees with plan of care (POC), all  questions answered. The patient is advised to call or return to clinic if condition does not see an improvement in symptoms, or to seek the care of the closest emergency department if condition worsens with the below plan.    1. URI, acute  2. Sore throat - POCT rapid strep A - magic mouthwash w/lidocaine SOLN; Take 5 mLs by mouth 4 (four) times daily as needed for mouth pain.  Dispense: 120 mL; Refill: 0  3. Cough - benzonatate (TESSALON PERLES) 100 MG capsule; Take 2 capsules (200 mg total) by mouth 3 (three) times daily.  Dispense: 20 capsule;  Refill: 0  Patient with two day history of URI symptoms; nasal congestion, PND, bilateral ear fullness/pressure, and cough. Concern due to recent oral surgery; was not prescribed pre or post surgical antibiotic. Short duration of URI symptoms. Surgical site with no indication of infection. Patient afebrile, VSS (elevated BP), in no acute distress, bilateral TMs with no abnormality. Advised continuation of Claritin, Flonase, and Astelin nasal spray. Advised continuation of Naproxen and Tramadol for pain. Prescribed Magic Mouthwash and Tessalon Perles for symptom relief. Advised patient call oral surgeon or be seen at urgent care/ED this weekend if she develops fever, facial swelling, purulent drainage from wound site, or worsening dental pain. Advised patient follow-up with oral surgeon as scheduled on Tuesday. Advised patient f/u with PCP, urgent care, or [facility] in one week if continued URI symptoms. Patient agreed with plan.   Angela Osborn, MHS, PA-C Angela Osborn, MHS, PA-C Advanced Practice Provider [facility]  [address] [facility], 1st [address] [place], [place] [address] (p):  [phone] [email or web address]

## 2018-12-24 NOTE — Patient Instructions (Addendum)
Thank you for choosing [facility] for your health care needs.  You have been diagnosed with an upper respiratory infection (a cold).  Your rapid strep test was negative.  Recommend continuing to use Claritin, Flonase nasal spray, and Astelin nasal spray. May continue Naproxen for pain and inflammation.  You have been prescribed magic mouthwash (will help with mouth and throat pain). You have been prescribed Tessalon Perles (will help with cough).  Follow-up with oral surgeon on Tuesday as scheduled.  Call oral surgeon and/or go to the urgent care if you develop fever, facial swelling, purulent/pus drainage from wound, or worsening dental pain.  May follow-up with family physician, urgent care, or [facility] in one week if cold symptoms not improving.  Hope you feel better soon!  Upper Respiratory Infection, Adult An upper respiratory infection (URI) affects the nose, throat, and upper air passages. URIs are caused by germs (viruses). The most common type of URI is often called "the common cold." Medicines cannot cure URIs, but you can do things at home to relieve your symptoms. URIs usually get better within 7-10 days. Follow these instructions at home: Activity  Rest as needed.  If you have a fever, stay home from work or school until your fever is gone, or until your doctor says you may return to work or school. ? You should stay home until you cannot spread the infection anymore (you are not contagious). ? Your doctor may have you wear a face mask so you have less risk of spreading the infection. Relieving symptoms  Gargle with a salt-water mixture 3-4 times a day or as needed. To make a salt-water mixture, completely dissolve -1 tsp of salt in 1 cup of warm water.  Use a cool-mist humidifier to add moisture to the air. This can help you breathe more easily. Eating and drinking   Drink enough fluid to keep your pee (urine) pale yellow.  Eat soups and other clear  broths. General instructions   Take over-the-counter and prescription medicines only as told by your doctor. These include cold medicines, fever reducers, and cough suppressants.  Do not use any products that contain nicotine or tobacco. These include cigarettes and e-cigarettes. If you need help quitting, ask your doctor.  Avoid being where people are smoking (avoid secondhand smoke).  Make sure you get regular shots and get the flu shot every year.  Keep all follow-up visits as told by your doctor. This is important. How to avoid spreading infection to others   Wash your hands often with soap and water. If you do not have soap and water, use hand sanitizer.  Avoid touching your mouth, face, eyes, or nose.  Cough or sneeze into a tissue or your sleeve or elbow. Do not cough or sneeze into your hand or into the air. Contact a doctor if:  You are getting worse, not better.  You have any of these: ? A fever. ? Chills. ? Brown or red mucus in your nose. ? Yellow or brown fluid (discharge)coming from your nose. ? Pain in your face, especially when you bend forward. ? Swollen neck glands. ? Pain with swallowing. ? White areas in the back of your throat. Get help right away if:  You have shortness of breath that gets worse.  You have very bad or constant: ? Headache. ? Ear pain. ? Pain in your forehead, behind your eyes, and over your cheekbones (sinus pain). ? Chest pain.  You have long-lasting (chronic) lung disease along with any  of these: ? Wheezing. ? Long-lasting cough. ? Coughing up blood. ? A change in your usual mucus.  You have a stiff neck.  You have changes in your: ? Vision. ? Hearing. ? Thinking. ? Mood. Summary  An upper respiratory infection (URI) is caused by a germ called a virus. The most common type of URI is often called "the common cold."  URIs usually get better within 7-10 days.  Take over-the-counter and prescription medicines only as  told by your doctor. This information is not intended to replace advice given to you by your health care provider. Make sure you discuss any questions you have with your health care provider. Document Released: 03/24/2008 Document Revised: 05/29/2017 Document Reviewed: 05/29/2017 Elsevier Interactive Patient Education  2019 [employer].

## 2018-12-27 ENCOUNTER — Ambulatory Visit (INDEPENDENT_AMBULATORY_CARE_PROVIDER_SITE_OTHER): Payer: Self-pay | Admitting: Physician Assistant

## 2018-12-27 VITALS — BP 126/90 | HR 96 | Temp 97.9°F | Wt 201.0 lb

## 2018-12-27 DIAGNOSIS — Z9289 Personal history of other medical treatment: Secondary | ICD-10-CM

## 2018-12-27 DIAGNOSIS — J069 Acute upper respiratory infection, unspecified: Secondary | ICD-10-CM

## 2018-12-27 NOTE — Progress Notes (Signed)
MRN: [id number] DOB: 1971-09-13  Subjective:   Angela Osborn is a 48 y.o. female presenting for chief complaint of Facial pressure (x2d) and Sinusitis (x2d) . Patient was seen in [place] on 12/24/18 for sore throat, postnasal drip, ear fullness, nasal congestion, and dry cough x 1 day.  On 12/21/2018, she underwent peridontal surgery.  Had an exposed nerve under her lower incisors covered with a skin graft from the left upper gum/palate.  Appropriate postsurgical antibiotic was prescribed.  She was supposed to follow-up tomorrow for postop but has been having consistent pain at the area and now it is shooting into the left sinus area and left ear.  Feels like a dry socket.  She called them this morning and they told her they could see her today.  She wanted to come by here first to see if she could just get an antibiotic.  She officially ran out of her pain medication this morning.  Denies fever, chills, body aches, purulent nasal drainage, decrease swelling, numbness, tingling, productive cough, shortness of breath, difficulty swallowing, nausea, vomiting, and diarrhea.  She is currently taking Tessalon Perles, Claritin, Flonase, Astelin nasal spray, Magic mouthwash, Sudafed, and Benadryl.  Denies smoking.  No PMH of asthma, COPD, diabetes, heart disease, and hypertension. Denies any other aggravating or relieving factors, no other questions or concerns.  Review of Systems  Constitutional: Negative for diaphoresis.  Skin: Negative for rash.  Neurological: Negative for dizziness.    Asal has a current medication list which includes the following prescription(s): vitamin c, azelastine, benzonatate, calcium-magnesium-zinc, fluticasone, ibuprofen, magic mouthwash w/lidocaine, meloxicam, misc natural products, multi-vitamins, naproxen sodium, omeprazole, probiotic product, tizanidine, turmeric, and lidocaine. Also is allergic to ciprofloxacin and sulfa antibiotics.  Angela Osborn  has no past medical history  on file. Also  has no past surgical history on file.   Objective:   Vitals: BP 126/90 (BP Location: Left Arm, Patient Position: Sitting, Cuff Size: Large)   Pulse 96   Temp 97.9 F (36.6 C)   Wt 201 lb (91.2 kg)   SpO2 99%   BMI 31.48 kg/m   Physical Exam Vitals signs reviewed.  Constitutional:      General: She is not in acute distress.    Appearance: She is well-developed. She is not ill-appearing or toxic-appearing.  HENT:     Head: Normocephalic and atraumatic. No right periorbital erythema or left periorbital erythema.     Jaw: No swelling.     Right Ear: Ear canal and external ear normal. A middle ear effusion is present. Tympanic membrane is not perforated, erythematous or bulging.     Left Ear: Ear canal and external ear normal. A middle ear effusion is present. Tympanic membrane is not perforated, erythematous or bulging.     Nose: Mucosal edema (mild), congestion and rhinorrhea present. Rhinorrhea is clear.     Right Sinus: No maxillary sinus tenderness or frontal sinus tenderness.     Left Sinus: No maxillary sinus tenderness or frontal sinus tenderness.     Mouth/Throat:     Lips: Pink.     Mouth: Mucous membranes are moist.     Dentition: No dental abscesses.     Pharynx: Oropharynx is clear. Uvula midline.     Tonsils: No tonsillar exudate or tonsillar abscesses.     Comments: Left upper inner gingiva with ~0.5cm x 2cm rectangular surgical wound. Well healing. No active bleeding. Scant possible serous drainage. Second surgical wound on lower incisors, outer portion of gum (placement of graft).  Well healing. No edema, erythema, or purulent drainage. Mild tenderness with palpation Eyes:     Conjunctiva/sclera: Conjunctivae normal.  Neck:     Musculoskeletal: Normal range of motion.  Cardiovascular:     Rate and Rhythm: Normal rate and regular rhythm.     Heart sounds: Normal heart sounds.  Pulmonary:     Effort: Pulmonary effort is normal.     Breath sounds:  Normal breath sounds. No decreased breath sounds, wheezing, rhonchi or rales.  Lymphadenopathy:     Head:     Right side of head: No submental, submandibular, tonsillar, preauricular, posterior auricular or occipital adenopathy.     Left side of head: No submental, submandibular, tonsillar, preauricular, posterior auricular or occipital adenopathy.     Cervical: No cervical adenopathy.     Upper Body:     Right upper body: No supraclavicular adenopathy.     Left upper body: No supraclavicular adenopathy.  Skin:    General: Skin is warm and dry.  Neurological:     Mental Status: She is alert.     No results found for this or any previous visit (from the past 24 hour(s)).  Assessment and Plan :  1. Viral URI 2. History of dental surgery URI sx still consistent with viral URI-she is overall well appearing, NAD. VSS. No red flags noted. Would rec continuing symptomatic tx at this time.  In terms of her left sided dental, sinus, and ear pain-believe this is all stemming from dental procedure site. B/l TMs with MEE-no erythema or bulging, no pain with palpation of b/l sinuses. No signs of obvious overlying infection at site at this time. She is afebrile, no purulent drainage, erythema, or edema. Advised pt that she will need to f/u with oral surgeon regarding this today or tomorrow. She voices her understanding and agrees to be seen by them today.  Benjiman Core, Cordelia Poche  [facility] 12/27/2018 9:54 AM

## 2018-12-27 NOTE — Patient Instructions (Addendum)
Continue medication regimen as you are.  Go see oral surgeon today.  If your sinus pressure is still persisting over the next 3-5 days and your oral surgeon did not think the pain was from your surgery,please call our office as you may warrant antibiotics at that time.  If you develop any facial swelling, fever, confusion, visual disturbance, worsening dental pain or other concerning symptoms, please seek care immediately at ED.

## 2018-12-29 ENCOUNTER — Telehealth: Payer: Self-pay | Admitting: Emergency Medicine

## 2018-12-30 DIAGNOSIS — J3489 Other specified disorders of nose and nasal sinuses: Secondary | ICD-10-CM | POA: Diagnosis not present

## 2019-01-04 DIAGNOSIS — H5202 Hypermetropia, left eye: Secondary | ICD-10-CM | POA: Diagnosis not present

## 2019-01-04 DIAGNOSIS — H52223 Regular astigmatism, bilateral: Secondary | ICD-10-CM | POA: Diagnosis not present

## 2019-01-04 DIAGNOSIS — H40013 Open angle with borderline findings, low risk, bilateral: Secondary | ICD-10-CM | POA: Diagnosis not present

## 2019-01-04 DIAGNOSIS — H524 Presbyopia: Secondary | ICD-10-CM | POA: Diagnosis not present

## 2019-04-19 DIAGNOSIS — N816 Rectocele: Secondary | ICD-10-CM | POA: Diagnosis not present

## 2019-04-19 DIAGNOSIS — N8110 Cystocele, unspecified: Secondary | ICD-10-CM | POA: Diagnosis not present

## 2019-04-19 DIAGNOSIS — N812 Incomplete uterovaginal prolapse: Secondary | ICD-10-CM | POA: Diagnosis not present

## 2019-06-28 ENCOUNTER — Other Ambulatory Visit: Payer: Self-pay | Admitting: Obstetrics and Gynecology

## 2019-06-28 DIAGNOSIS — Z1231 Encounter for screening mammogram for malignant neoplasm of breast: Secondary | ICD-10-CM

## 2019-07-11 DIAGNOSIS — J3489 Other specified disorders of nose and nasal sinuses: Secondary | ICD-10-CM | POA: Diagnosis not present

## 2019-07-11 DIAGNOSIS — Z20828 Contact with and (suspected) exposure to other viral communicable diseases: Secondary | ICD-10-CM | POA: Diagnosis not present

## 2019-07-11 DIAGNOSIS — R5383 Other fatigue: Secondary | ICD-10-CM | POA: Diagnosis not present

## 2019-08-08 ENCOUNTER — Inpatient Hospital Stay
Admission: RE | Admit: 2019-08-08 | Discharge: 2019-08-08 | Disposition: A | Discharge status: Home / Self Care | Payer: Self-pay | Source: Ambulatory Visit | Attending: Obstetrics and Gynecology | Admitting: Obstetrics and Gynecology

## 2019-08-08 ENCOUNTER — Other Ambulatory Visit: Payer: Self-pay | Admitting: Obstetrics and Gynecology

## 2019-08-08 ENCOUNTER — Ambulatory Visit
Admission: RE | Admit: 2019-08-08 | Discharge: 2019-08-08 | Disposition: A | Discharge status: Home / Self Care | Payer: 59 | Source: Ambulatory Visit | Attending: Obstetrics and Gynecology | Admitting: Obstetrics and Gynecology

## 2019-08-08 DIAGNOSIS — Z1231 Encounter for screening mammogram for malignant neoplasm of breast: Secondary | ICD-10-CM

## 2019-08-17 DIAGNOSIS — H6982 Other specified disorders of Eustachian tube, left ear: Secondary | ICD-10-CM | POA: Diagnosis not present

## 2019-08-17 DIAGNOSIS — H93A2 Pulsatile tinnitus, left ear: Secondary | ICD-10-CM | POA: Diagnosis not present

## 2019-09-21 DIAGNOSIS — H6981 Other specified disorders of Eustachian tube, right ear: Secondary | ICD-10-CM | POA: Diagnosis not present

## 2019-09-21 DIAGNOSIS — J019 Acute sinusitis, unspecified: Secondary | ICD-10-CM | POA: Diagnosis not present

## 2019-09-21 DIAGNOSIS — J301 Allergic rhinitis due to pollen: Secondary | ICD-10-CM | POA: Diagnosis not present

## 2019-10-07 DIAGNOSIS — M25512 Pain in left shoulder: Secondary | ICD-10-CM | POA: Diagnosis not present

## 2019-10-29 DIAGNOSIS — Z0000 Encounter for general adult medical examination without abnormal findings: Secondary | ICD-10-CM | POA: Diagnosis not present

## 2019-11-03 DIAGNOSIS — Z0000 Encounter for general adult medical examination without abnormal findings: Secondary | ICD-10-CM | POA: Diagnosis not present

## 2019-12-06 DIAGNOSIS — Z1211 Encounter for screening for malignant neoplasm of colon: Secondary | ICD-10-CM | POA: Diagnosis not present

## 2019-12-06 DIAGNOSIS — K219 Gastro-esophageal reflux disease without esophagitis: Secondary | ICD-10-CM | POA: Diagnosis not present

## 2020-01-10 ENCOUNTER — Ambulatory Visit
Admission: EM | Admit: 2020-01-10 | Discharge: 2020-01-10 | Disposition: A | Discharge status: Home / Self Care | Payer: 59 | Attending: Family Medicine | Admitting: Family Medicine

## 2020-01-10 ENCOUNTER — Other Ambulatory Visit: Payer: Self-pay

## 2020-01-10 DIAGNOSIS — J988 Other specified respiratory disorders: Secondary | ICD-10-CM | POA: Diagnosis not present

## 2020-01-10 DIAGNOSIS — Z20822 Contact with and (suspected) exposure to covid-19: Secondary | ICD-10-CM

## 2020-01-10 DIAGNOSIS — U071 COVID-19: Secondary | ICD-10-CM | POA: Diagnosis not present

## 2020-01-10 LAB — SARS CORONAVIRUS 2 (TAT 6-24 HRS): SARS Coronavirus 2: POSITIVE — AB

## 2020-01-10 MED ORDER — AMOXICILLIN-POT CLAVULANATE 875-125 MG PO TABS: 1.0000 | ORAL_TABLET | Freq: Two times a day (BID) | ORAL | 0 refills | Status: DC

## 2020-01-10 NOTE — ED Provider Notes (Signed)
[facility]    CSN: [id number] Arrival date & time: 01/10/20  1211  History   Chief Complaint Chief Complaint  Patient presents with  . Sinus Problem   HPI  49 year old female presents with respiratory symptoms.  Patient states that she began to get sick over the weekend.  Symptoms have progressed and have not significantly improved.  She states that her husband is sick as well and has had fever.  She is concerned that she may have COVID-19.  However, patient believes that she has a sinus infection.  She states that she struggles with chronic sinusitis/sinus infections.  She suffers from allergies as well.  She reports sinus congestion, sinus pain and pressure, headache, chills, cough, fatigue.  She has taken Sudafed without resolution although it has helped some.  No fever.  No other associated symptoms.  No other complaints concerns at this time.  Patient Active Problem List   Diagnosis Date Noted  . Hamstring injury, left, initial encounter 05/07/2017  . Degenerative disc disease at L5-S1 level 04/09/2017  . Arthritis of facet joint of lumbar spine 04/09/2017  . Nonallopathic lesion of lumbosacral region 04/09/2017  . Nonallopathic lesion of thoracic region 04/09/2017  . Nonallopathic lesion of sacral region 04/09/2017  . Chronic right-sided low back pain 12/23/2016  . Residual foreign body in soft tissue 11/30/2015  . Onychomycosis 11/30/2015  . Allergic rhinitis 11/19/2015  . Back ache 11/19/2015  . Acid reflux 11/19/2015  . Anal fissure 08/13/2014  . Hemorrhoid 08/13/2014   Home Medications    Prior to Admission medications   Medication Sig Start Date End Date Taking? Authorizing Provider  Ascorbic Acid (VITAMIN C) 1000 MG tablet Take 1,000 mg by mouth daily.   Yes [provider]  azelastine (ASTELIN) 0.1 % nasal spray USE 2 SPRAYS IN EACH NOSTRIL TWICE A DAY 11/19/15  Yes [provider]  CALCIUM-MAGNESIUM-ZINC PO Take by mouth.   Yes  [provider]  fluticasone (FLONASE) 50 MCG/ACT nasal spray SQUIRT 2 SPRAYS EACH NOSTRIL ONCE A DAY 06/06/14  Yes [provider]  IBUPROFEN PO Take by mouth as needed.   Yes [provider]  meloxicam (MOBIC) 15 MG tablet TAKE 1 TABLET BY MOUTH DAILY AFTER A MEAL 07/12/18  Yes [provider]  Misc Natural Products (TART CHERRY ADVANCED PO) Take by mouth.   Yes [provider]  Multiple Vitamin (MULTI-VITAMINS) TABS Take by mouth.   Yes [provider]  omeprazole (PRILOSEC) 40 MG capsule Take by mouth. 05/21/14  Yes [provider]  Probiotic Product (PROBIOTIC-10 PO) Take by mouth.   Yes [provider]  tiZANidine (ZANAFLEX) 4 MG tablet 1 hs 10/06/18  Yes [provider]  Turmeric 500 MG CAPS Take by mouth.   Yes [provider]  amoxicillin-clavulanate (AUGMENTIN) 875-125 MG tablet Take 1 tablet by mouth every 12 (twelve) hours. 01/10/20   Tommie Sams, DO    Family History Family History  Problem Relation Age of Onset  . Breast cancer Maternal Aunt 50  . Breast cancer Maternal Aunt 33    Social History Social History   Tobacco Use  . Smoking status: Never Smoker  . Smokeless tobacco: Never Used  Substance Use Topics  . Alcohol use: No  . Drug use: Not on file     Allergies   Ciprofloxacin and Sulfa antibiotics   Review of Systems Review of Systems  Constitutional: Positive for chills and fatigue. Negative for fever.  HENT: Positive  for congestion, sinus pressure and sinus pain.   Respiratory: Positive for cough.   Neurological: Positive for headaches.   Physical Exam Triage Vital Signs ED Triage Vitals  Enc Vitals Group     BP 01/10/20 1236 (!) 135/95     Pulse Rate 01/10/20 1236 89     Resp 01/10/20 1236 18     Temp 01/10/20 1236 98.3 F (36.8 C)     Temp Source 01/10/20 1236 Oral     SpO2 01/10/20 1236 100 %     Weight 01/10/20 1232 210 lb (95.3 kg)     Height  01/10/20 1232 5\' 7"  (1.702 m)     Head Circumference --      Peak Flow --      Pain Score 01/10/20 1232 0     Pain Loc --      Pain Edu? --      Excl. in [place]? --    Updated Vital Signs BP (!) 135/95 (BP Location: Left Arm)   Pulse 89   Temp 98.3 F (36.8 C) (Oral)   Resp 18   Ht 5\' 7"  (1.702 m)   Wt 95.3 kg   SpO2 100%   BMI 32.89 kg/m   Visual Acuity Right Eye Distance:   Left Eye Distance:   Bilateral Distance:    Right Eye Near:   Left Eye Near:    Bilateral Near:     Physical Exam Constitutional:      General: She is not in acute distress.    Appearance: Normal appearance. She is not ill-appearing.  HENT:     Head: Normocephalic and atraumatic.     Right Ear: Tympanic membrane normal.     Left Ear: Tympanic membrane normal.     Mouth/Throat:     Pharynx: Oropharynx is clear. No posterior oropharyngeal erythema.  Eyes:     General:        Right eye: No discharge.        Left eye: No discharge.     Conjunctiva/sclera: Conjunctivae normal.  Cardiovascular:     Rate and Rhythm: Normal rate and regular rhythm.     Heart sounds: No murmur.  Pulmonary:     Effort: Pulmonary effort is normal.     Breath sounds: Normal breath sounds. No wheezing, rhonchi or rales.  Neurological:     Mental Status: She is alert.  Psychiatric:        Mood and Affect: Mood normal.        Behavior: Behavior normal.    UC Treatments / Results  Labs (all labs ordered are listed, but only abnormal results are displayed) Labs Reviewed  SARS CORONAVIRUS 2 (TAT 6-24 HRS)    EKG   Radiology No results found.  Procedures Procedures (including critical care time)  Medications Ordered in UC Medications - No data to display  Initial Impression / Assessment and Plan / UC Course  I have reviewed the triage vital signs and the nursing notes.  Pertinent labs & imaging results that were available during my care of the patient were reviewed by me and considered in my medical  decision making (see chart for details).    49 year old female presents with respiratory infection.  Given history of recurrent sinusitis, placing empirically on Augmentin while awaiting Covid test results.  Supportive care.  Final Clinical Impressions(s) / UC Diagnoses   Final diagnoses:  Respiratory infection  Encounter for laboratory testing for COVID-19 virus     Discharge Instructions  Results available in 24 to 48 hours.  Stay home.  Medication as prescribed.  Take care  Dr. Lacinda Axon     ED Prescriptions    Medication Sig Dispense Auth. Provider   amoxicillin-clavulanate (AUGMENTIN) 875-125 MG tablet Take 1 tablet by mouth every 12 (twelve) hours. 14 tablet Coral Spikes, DO     PDMP not reviewed this encounter.   Coral Spikes, [place] 01/10/20 1636

## 2020-01-10 NOTE — Discharge Instructions (Signed)
Results available in 24 to 48 hours.  Stay home.  Medication as prescribed.  Take care  Dr. Adriana Simas

## 2020-01-10 NOTE — ED Triage Notes (Signed)
Pt presents with c/o sinus congestion, facial pain/pressure and headache, chills, cough, fatigue and abdominal pain. Pt states her husband is also sick. She would like to be tested for COVID. Pt denies fever, n/v/d or other symptoms.

## 2020-01-11 ENCOUNTER — Telehealth (HOSPITAL_COMMUNITY): Payer: Self-pay

## 2020-01-11 ENCOUNTER — Telehealth: Payer: Self-pay | Admitting: Emergency Medicine

## 2020-01-11 NOTE — Telephone Encounter (Signed)
-----   Message from Tommie Sams, DO sent at 01/11/2020  1:46 PM EDT ----- Call patient and inform COVID positive.  Stop Antibiotic. Sinus symptoms from COVID.   Everlene Other DO

## 2020-01-11 NOTE — Telephone Encounter (Signed)
Patient notified to d/c antibiotic since she is COVID positive. Patient verbalized understanding.

## 2020-01-11 NOTE — Telephone Encounter (Signed)
Patient contacted by phone and made aware of  COVID  results. Pt verbalized understanding and had all questions answered.  Your test for COVID-19 was positive, meaning that you were infected with the novel coronavirus and could give the germ to others.  Please continue isolation at home for at least 10 days since the start of your symptoms. If you do not have symptoms, please isolate at home for 10 days from the day you were tested. Once you complete your 10 day quarantine, you may return to normal activities as long as you've not had a fever for over 24 hours(without taking fever reducing medicine) and your symptoms are improving. Please continue good preventive care measures, including:  frequent hand-washing, avoid touching your face, cover coughs/sneezes, stay out of crowds and keep a 6 foot distance from others.  Go to the nearest hospital emergency room if fever/cough/breathlessness are severe or illness seems like a threat to life. 

## 2020-01-17 DIAGNOSIS — R05 Cough: Secondary | ICD-10-CM | POA: Diagnosis not present

## 2020-03-04 ENCOUNTER — Encounter: Payer: Self-pay | Admitting: Emergency Medicine

## 2020-03-04 ENCOUNTER — Other Ambulatory Visit: Payer: Self-pay

## 2020-03-04 ENCOUNTER — Emergency Department: Payer: 59

## 2020-03-04 ENCOUNTER — Emergency Department
Admission: EM | Admit: 2020-03-04 | Discharge: 2020-03-04 | Disposition: A | Discharge status: Home / Self Care | Payer: 59 | Attending: Emergency Medicine | Admitting: Emergency Medicine

## 2020-03-04 DIAGNOSIS — Z79899 Other long term (current) drug therapy: Secondary | ICD-10-CM | POA: Diagnosis not present

## 2020-03-04 DIAGNOSIS — W108XXA Fall (on) (from) other stairs and steps, initial encounter: Secondary | ICD-10-CM | POA: Insufficient documentation

## 2020-03-04 DIAGNOSIS — Y998 Other external cause status: Secondary | ICD-10-CM | POA: Insufficient documentation

## 2020-03-04 DIAGNOSIS — S6992XA Unspecified injury of left wrist, hand and finger(s), initial encounter: Secondary | ICD-10-CM | POA: Diagnosis present

## 2020-03-04 DIAGNOSIS — S52502A Unspecified fracture of the lower end of left radius, initial encounter for closed fracture: Secondary | ICD-10-CM | POA: Insufficient documentation

## 2020-03-04 DIAGNOSIS — Y9301 Activity, walking, marching and hiking: Secondary | ICD-10-CM | POA: Diagnosis not present

## 2020-03-04 DIAGNOSIS — Y92018 Other place in single-family (private) house as the place of occurrence of the external cause: Secondary | ICD-10-CM | POA: Insufficient documentation

## 2020-03-04 DIAGNOSIS — S52612A Displaced fracture of left ulna styloid process, initial encounter for closed fracture: Secondary | ICD-10-CM | POA: Diagnosis not present

## 2020-03-04 DIAGNOSIS — S52512A Displaced fracture of left radial styloid process, initial encounter for closed fracture: Secondary | ICD-10-CM | POA: Diagnosis not present

## 2020-03-04 MED ORDER — ONDANSETRON 4 MG PO TBDP
4.0000 mg | ORAL_TABLET | Freq: Once | ORAL | Status: AC
  Administered 2020-03-04: 4 mg via ORAL
  Filled 2020-03-04: qty 1

## 2020-03-04 MED ORDER — OXYCODONE-ACETAMINOPHEN 5-325 MG PO TABS: 1.0000 | ORAL_TABLET | Freq: Four times a day (QID) | ORAL | 0 refills | Status: AC | PRN

## 2020-03-04 MED ORDER — OXYCODONE-ACETAMINOPHEN 5-325 MG PO TABS
1.0000 | ORAL_TABLET | Freq: Once | ORAL | Status: AC
  Administered 2020-03-04: 1 via ORAL
  Filled 2020-03-04: qty 1

## 2020-03-04 MED ORDER — ONDANSETRON 4 MG PO TBDP: 4.0000 mg | ORAL_TABLET | Freq: Three times a day (TID) | ORAL | 0 refills | Status: AC | PRN

## 2020-03-04 NOTE — Discharge Instructions (Signed)
You can take Percocet for pain. Please make follow-up appointment with Dr. Rennis Chris.

## 2020-03-04 NOTE — ED Notes (Signed)
Patient states she was walking down steps at home and fell down and landed on left wrist and arm. Patient states pain is 7/10 when holding down below heart level.

## 2020-03-04 NOTE — ED Provider Notes (Signed)
Emergency Department Provider Note  ____________________________________________  Time seen: Approximately 11:05 PM  I have reviewed the triage vital signs and the nursing notes.   HISTORY  Chief Complaint Wrist Pain   Historian Patient    HPI Angela Osborn is a 49 y.o. female presents to the emergency department with acute left wrist pain.  Patient states that she was walking down her steps while bringing in items from [facility] when she fell approximately 3 feet from several steps.  She denies hitting her head or her neck.  Patient states that she thinks that she might have fallen on outstretched left wrist.  No numbness or tingling in the left hand.  No abrasions or lacerations.  No other alleviating measures have been attempted.   History reviewed. No pertinent past medical history.   Immunizations up to date:  Yes.     History reviewed. No pertinent past medical history.  Patient Active Problem List   Diagnosis Date Noted  . Hamstring injury, left, initial encounter 05/07/2017  . Degenerative disc disease at L5-S1 level 04/09/2017  . Arthritis of facet joint of lumbar spine 04/09/2017  . Nonallopathic lesion of lumbosacral region 04/09/2017  . Nonallopathic lesion of thoracic region 04/09/2017  . Nonallopathic lesion of sacral region 04/09/2017  . Chronic right-sided low back pain 12/23/2016  . Residual foreign body in soft tissue 11/30/2015  . Onychomycosis 11/30/2015  . Allergic rhinitis 11/19/2015  . Back ache 11/19/2015  . Acid reflux 11/19/2015  . Anal fissure 08/13/2014  . Hemorrhoid 08/13/2014    Past Surgical History:  Procedure Laterality Date  . BACK SURGERY      Prior to Admission medications   Medication Sig Start Date End Date Taking? Authorizing Provider  amoxicillin-clavulanate (AUGMENTIN) 875-125 MG tablet Take 1 tablet by mouth every 12 (twelve) hours. 01/10/20   Tommie Sams, DO  Ascorbic Acid (VITAMIN C) 1000 MG tablet Take 1,000 mg by  mouth daily.    [provider]  azelastine (ASTELIN) 0.1 % nasal spray USE 2 SPRAYS IN EACH NOSTRIL TWICE A DAY 11/19/15   [provider]  CALCIUM-MAGNESIUM-ZINC PO Take by mouth.    [provider]  fluticasone (FLONASE) 50 MCG/ACT nasal spray SQUIRT 2 SPRAYS EACH NOSTRIL ONCE A DAY 06/06/14   [provider]  IBUPROFEN PO Take by mouth as needed.    [provider]  meloxicam (MOBIC) 15 MG tablet TAKE 1 TABLET BY MOUTH DAILY AFTER A MEAL 07/12/18   [provider]  Misc Natural Products (TART CHERRY ADVANCED PO) Take by mouth.    [provider]  Multiple Vitamin (MULTI-VITAMINS) TABS Take by mouth.    [provider]  omeprazole (PRILOSEC) 40 MG capsule Take by mouth. 05/21/14   [provider]  ondansetron (ZOFRAN ODT) 4 MG disintegrating tablet Take 1 tablet (4 mg total) by mouth every 8 (eight) hours as needed for up to 5 days for nausea or vomiting. 03/04/20 03/09/20  Orvil Feil, PA-C  oxyCODONE-acetaminophen (PERCOCET/ROXICET) 5-325 MG tablet Take 1 tablet by mouth every 6 (six) hours as needed for up to 3 days. 03/04/20 03/07/20  Orvil Feil, PA-C  Probiotic Product (PROBIOTIC-10 PO) Take by mouth.    [provider]  tiZANidine (ZANAFLEX) 4 MG tablet 1 hs 10/06/18   [provider]  Turmeric 500 MG CAPS Take by mouth.    [provider]    Allergies Ciprofloxacin and Sulfa antibiotics  Family History  Problem Relation Age  of Onset  . Breast cancer Maternal Aunt 45  . Breast cancer Maternal Aunt 60    Social History Social History   Tobacco Use  . Smoking status: Never Smoker  . Smokeless tobacco: Never Used  Substance Use Topics  . Alcohol use: No  . Drug use: Never     Review of Systems  Constitutional: No fever/chills Eyes:  No discharge ENT: No upper respiratory complaints. Respiratory: no cough. No SOB/ use of accessory muscles to  breath Gastrointestinal:   No nausea, no vomiting.  No diarrhea.  No constipation. Musculoskeletal: Patient has left wrist pain.  Skin: Negative for rash, abrasions, lacerations, ecchymosis.   ____________________________________________   PHYSICAL EXAM:  VITAL SIGNS: ED Triage Vitals  Enc Vitals Group     BP 03/04/20 2113 (!) 177/96     Pulse Rate 03/04/20 2113 88     Resp 03/04/20 2113 18     Temp 03/04/20 2113 98.2 F (36.8 C)     Temp Source 03/04/20 2113 Oral     SpO2 03/04/20 2113 96 %     Weight 03/04/20 2112 205 lb (93 kg)     Height 03/04/20 2112 5\' 7"  (1.702 m)     Head Circumference --      Peak Flow --      Pain Score 03/04/20 2112 9     Pain Loc --      Pain Edu? --      Excl. in [place]? --      Constitutional: Alert and oriented. Well appearing and in no acute distress. Eyes: Conjunctivae are normal. PERRL. EOMI. Head: Atraumatic. Cardiovascular: Normal rate, regular rhythm. Normal S1 and S2.  Good peripheral circulation. Respiratory: Normal respiratory effort without tachypnea or retractions. Lungs CTAB. Good air entry to the bases with no decreased or absent breath sounds Gastrointestinal: Bowel sounds x 4 quadrants. Soft and nontender to palpation. No guarding or rigidity. No distention. Musculoskeletal: Patient is able to move all 5 left fingers.  Palpable radial pulse, left.  Patient is unable to perform full range of motion at the left wrist.  Capillary refill less than 2 seconds on the left. Neurologic:  Normal for age. No gross focal neurologic deficits are appreciated.  Skin:  Skin is warm, dry and intact. No rash noted. Psychiatric: Mood and affect are normal for age. Speech and behavior are normal.   ____________________________________________   LABS (all labs ordered are listed, but only abnormal results are displayed)  Labs Reviewed - No data to  display ____________________________________________  EKG   ____________________________________________  RADIOLOGY Unk Pinto, personally viewed and evaluated these images (plain radiographs) as part of my medical decision making, as well as reviewing the written report by the radiologist.  DG Wrist Complete Left  Result Date: 03/04/2020 CLINICAL DATA:  Left wrist pain after fall. Tripped on steps today. EXAM: LEFT WRIST - COMPLETE 3+ VIEW COMPARISON:  None. FINDINGS: Displaced and comminuted distal radius fracture involving the volar articular surface. Osseous distraction of approximately 5 mm. Articular distraction of at least 3 mm. There is displacement of the radial styloid about the the volar wrist. Carpal bones remain aligned with the dominant radial fragment. Minimally displaced ulna styloid fracture. Scaphoid and carpal bones are intact. Soft tissue edema. IMPRESSION: 1. Displaced and comminuted distal radius fracture primarily involving the radial styloid without osseous distraction at the radiocarpal joint. 2. Minimally displaced ulna styloid fracture. Electronically Signed   By: Keith Rake M.D.   On:  03/04/2020 22:02    ____________________________________________    PROCEDURES  Procedure(s) performed:     Procedures     Medications  oxyCODONE-acetaminophen (PERCOCET/ROXICET) 5-325 MG per tablet 1 tablet (1 tablet Oral Given 03/04/20 2249)  ondansetron (ZOFRAN-ODT) disintegrating tablet 4 mg (4 mg Oral Given 03/04/20 2249)     ____________________________________________   INITIAL IMPRESSION / ASSESSMENT AND PLAN / ED COURSE  Pertinent labs & imaging results that were available during my care of the patient were reviewed by me and considered in my medical decision making (see chart for details).      Assessment and plan Distal radius fracture 49 year old female presents to the emergency department with acute left wrist pain after a mechanical  fall.  X-ray of the left wrist reveals a comminuted and mildly displaced radial styloid fracture.  Patient was placed in a volar splint and Percocet was given for pain.  She was advised to follow-up with orthopedics.  Patient requested a referral to Dr. Rennis Chris who she states that she has been under the care of of in the past.  Return precautions were given to return with new or worsening symptoms.  All patient questions were answered.    ____________________________________________  FINAL CLINICAL IMPRESSION(S) / ED DIAGNOSES  Final diagnoses:  Closed fracture of distal end of left radius, unspecified fracture morphology, initial encounter      NEW MEDICATIONS STARTED DURING THIS VISIT:  ED Discharge Orders         Ordered    oxyCODONE-acetaminophen (PERCOCET/ROXICET) 5-325 MG tablet  Every 6 hours PRN     03/04/20 2300    ondansetron (ZOFRAN ODT) 4 MG disintegrating tablet  Every 8 hours PRN     03/04/20 2300              This chart was dictated using voice recognition software/Dragon. Despite best efforts to proofread, errors can occur which can change the meaning. Any change was purely unintentional.     Orvil Feil, PA-C 03/04/20 2307    Charlynne Pander, MD 03/04/20 [phone]

## 2020-03-04 NOTE — ED Triage Notes (Signed)
Pt arrives ambulatory and POV to triage with c/o left wrist pain. Pt fell down 3-4 steps and landed on her left hand. Pt has swelling noted to left wrist but is in NAD.

## 2020-03-05 DIAGNOSIS — M25532 Pain in left wrist: Secondary | ICD-10-CM | POA: Diagnosis not present

## 2020-03-05 DIAGNOSIS — S52562A Barton's fracture of left radius, initial encounter for closed fracture: Secondary | ICD-10-CM | POA: Diagnosis not present

## 2020-03-05 DIAGNOSIS — S52509A Unspecified fracture of the lower end of unspecified radius, initial encounter for closed fracture: Secondary | ICD-10-CM | POA: Insufficient documentation

## 2020-03-06 ENCOUNTER — Ambulatory Visit (HOSPITAL_COMMUNITY): Payer: 59 | Admitting: Anesthesiology

## 2020-03-06 ENCOUNTER — Other Ambulatory Visit: Payer: Self-pay

## 2020-03-06 ENCOUNTER — Ambulatory Visit (HOSPITAL_COMMUNITY)
Admission: RE | Admit: 2020-03-06 | Discharge: 2020-03-06 | Disposition: A | Discharge status: Home / Self Care | Payer: 59 | Attending: Orthopedic Surgery | Admitting: Orthopedic Surgery

## 2020-03-06 ENCOUNTER — Encounter (HOSPITAL_COMMUNITY): Payer: Self-pay | Admitting: Orthopedic Surgery

## 2020-03-06 ENCOUNTER — Encounter (HOSPITAL_COMMUNITY)
Admission: RE | Disposition: A | Discharge status: Home / Self Care | Payer: Self-pay | Source: Home / Self Care | Attending: Orthopedic Surgery

## 2020-03-06 ENCOUNTER — Ambulatory Visit (HOSPITAL_COMMUNITY): Payer: 59

## 2020-03-06 DIAGNOSIS — Z79899 Other long term (current) drug therapy: Secondary | ICD-10-CM | POA: Diagnosis not present

## 2020-03-06 DIAGNOSIS — J309 Allergic rhinitis, unspecified: Secondary | ICD-10-CM | POA: Diagnosis not present

## 2020-03-06 DIAGNOSIS — M1990 Unspecified osteoarthritis, unspecified site: Secondary | ICD-10-CM | POA: Insufficient documentation

## 2020-03-06 DIAGNOSIS — W010XXA Fall on same level from slipping, tripping and stumbling without subsequent striking against object, initial encounter: Secondary | ICD-10-CM | POA: Insufficient documentation

## 2020-03-06 DIAGNOSIS — S52572A Other intraarticular fracture of lower end of left radius, initial encounter for closed fracture: Secondary | ICD-10-CM | POA: Diagnosis not present

## 2020-03-06 DIAGNOSIS — S52562A Barton's fracture of left radius, initial encounter for closed fracture: Secondary | ICD-10-CM | POA: Insufficient documentation

## 2020-03-06 DIAGNOSIS — Z6832 Body mass index (BMI) 32.0-32.9, adult: Secondary | ICD-10-CM | POA: Insufficient documentation

## 2020-03-06 DIAGNOSIS — K219 Gastro-esophageal reflux disease without esophagitis: Secondary | ICD-10-CM | POA: Insufficient documentation

## 2020-03-06 DIAGNOSIS — Z8616 Personal history of COVID-19: Secondary | ICD-10-CM | POA: Insufficient documentation

## 2020-03-06 HISTORY — PX: OPEN REDUCTION INTERNAL FIXATION (ORIF) DISTAL RADIAL FRACTURE: SHX5989

## 2020-03-06 HISTORY — DX: Gastro-esophageal reflux disease without esophagitis: K21.9

## 2020-03-06 LAB — HEMOGLOBIN: Hemoglobin: 12 g/dL (ref 12.0–15.0)

## 2020-03-06 LAB — POCT PREGNANCY, URINE: Preg Test, Ur: NEGATIVE

## 2020-03-06 SURGERY — OPEN REDUCTION INTERNAL FIXATION (ORIF) DISTAL RADIUS FRACTURE
Anesthesia: Monitor Anesthesia Care | Laterality: Left

## 2020-03-06 MED ORDER — MIDAZOLAM HCL 2 MG/2ML IJ SOLN
INTRAMUSCULAR | Status: AC
  Administered 2020-03-06: 2 mg via INTRAVENOUS
  Filled 2020-03-06: qty 2

## 2020-03-06 MED ORDER — MIDAZOLAM HCL 2 MG/2ML IJ SOLN: 2.0000 mg | Freq: Once | INTRAMUSCULAR | Status: AC

## 2020-03-06 MED ORDER — OXYCODONE HCL 5 MG PO TABS: 5.0000 mg | ORAL_TABLET | Freq: Once | ORAL | Status: DC | PRN

## 2020-03-06 MED ORDER — 0.9 % SODIUM CHLORIDE (POUR BTL) OPTIME
TOPICAL | Status: DC | PRN
  Administered 2020-03-06: 1000 mL

## 2020-03-06 MED ORDER — PROPOFOL 500 MG/50ML IV EMUL
INTRAVENOUS | Status: DC | PRN
  Administered 2020-03-06: 125 ug/kg/min via INTRAVENOUS

## 2020-03-06 MED ORDER — ACETAMINOPHEN 500 MG PO TABS: 1000.0000 mg | ORAL_TABLET | Freq: Once | ORAL | Status: DC | PRN

## 2020-03-06 MED ORDER — LACTATED RINGERS IV SOLN: INTRAVENOUS | Status: DC | PRN

## 2020-03-06 MED ORDER — LACTATED RINGERS IV SOLN: INTRAVENOUS | Status: DC

## 2020-03-06 MED ORDER — FENTANYL CITRATE (PF) 100 MCG/2ML IJ SOLN: 100.0000 ug | Freq: Once | INTRAMUSCULAR | Status: AC

## 2020-03-06 MED ORDER — FENTANYL CITRATE (PF) 100 MCG/2ML IJ SOLN: 25.0000 ug | INTRAMUSCULAR | Status: DC | PRN

## 2020-03-06 MED ORDER — PROPOFOL 10 MG/ML IV BOLUS
INTRAVENOUS | Status: DC | PRN
  Administered 2020-03-06: 30 mg via INTRAVENOUS

## 2020-03-06 MED ORDER — ACETAMINOPHEN 10 MG/ML IV SOLN: 1000.0000 mg | Freq: Once | INTRAVENOUS | Status: DC | PRN

## 2020-03-06 MED ORDER — LIDOCAINE-EPINEPHRINE (PF) 1.5 %-1:200000 IJ SOLN
INTRAMUSCULAR | Status: DC | PRN
  Administered 2020-03-06: 10 mL via PERINEURAL

## 2020-03-06 MED ORDER — FENTANYL CITRATE (PF) 100 MCG/2ML IJ SOLN
INTRAMUSCULAR | Status: AC
  Administered 2020-03-06: 100 ug via INTRAVENOUS
  Filled 2020-03-06: qty 2

## 2020-03-06 MED ORDER — PROPOFOL 10 MG/ML IV BOLUS
INTRAVENOUS | Status: AC
  Filled 2020-03-06: qty 20

## 2020-03-06 MED ORDER — BUPIVACAINE-EPINEPHRINE (PF) 0.5% -1:200000 IJ SOLN
INTRAMUSCULAR | Status: DC | PRN
  Administered 2020-03-06: 30 mL via PERINEURAL

## 2020-03-06 MED ORDER — CEFAZOLIN SODIUM-DEXTROSE 2-4 GM/100ML-% IV SOLN
2.0000 g | Freq: Once | INTRAVENOUS | Status: AC
  Administered 2020-03-06: 2 g via INTRAVENOUS
  Filled 2020-03-06: qty 100

## 2020-03-06 MED ORDER — ACETAMINOPHEN 160 MG/5ML PO SOLN: 1000.0000 mg | Freq: Once | ORAL | Status: DC | PRN

## 2020-03-06 MED ORDER — OXYCODONE HCL 5 MG/5ML PO SOLN: 5.0000 mg | Freq: Once | ORAL | Status: DC | PRN

## 2020-03-06 SURGICAL SUPPLY — 62 items
BIT DRILL 2.2 SS TIBIAL (BIT) ×2 IMPLANT
BLADE CLIPPER SURG (BLADE) IMPLANT
BNDG ELASTIC 3X5.8 VLCR STR LF (GAUZE/BANDAGES/DRESSINGS) ×6 IMPLANT
BNDG ELASTIC 4X5.8 VLCR STR LF (GAUZE/BANDAGES/DRESSINGS) ×2 IMPLANT
BNDG ESMARK 4X9 LF (GAUZE/BANDAGES/DRESSINGS) ×2 IMPLANT
BNDG GAUZE ELAST 4 BULKY (GAUZE/BANDAGES/DRESSINGS) ×2 IMPLANT
CORD BIPOLAR FORCEPS 12FT (ELECTRODE) ×2 IMPLANT
COVER SURGICAL LIGHT HANDLE (MISCELLANEOUS) ×2 IMPLANT
COVER WAND RF STERILE (DRAPES) ×2 IMPLANT
CUFF TOURN SGL QUICK 18X4 (TOURNIQUET CUFF) ×2 IMPLANT
CUFF TOURN SGL QUICK 24 (TOURNIQUET CUFF)
CUFF TRNQT CYL 24X4X16.5-23 (TOURNIQUET CUFF) IMPLANT
DECANTER SPIKE VIAL GLASS SM (MISCELLANEOUS) IMPLANT
DRAIN TLS ROUND 10FR (DRAIN) IMPLANT
DRAPE OEC MINIVIEW 54X84 (DRAPES) IMPLANT
DRAPE U-SHAPE 47X51 STRL (DRAPES) ×2 IMPLANT
DRSG ADAPTIC 3X8 NADH LF (GAUZE/BANDAGES/DRESSINGS) ×2 IMPLANT
GAUZE SPONGE 4X4 12PLY STRL (GAUZE/BANDAGES/DRESSINGS) ×2 IMPLANT
GAUZE SPONGE 4X4 12PLY STRL LF (GAUZE/BANDAGES/DRESSINGS) ×2 IMPLANT
GAUZE XEROFORM 5X9 LF (GAUZE/BANDAGES/DRESSINGS) ×2 IMPLANT
GLOVE BIOGEL M 8.0 STRL (GLOVE) ×2 IMPLANT
GLOVE BIOGEL PI IND STRL 8 (GLOVE) ×1 IMPLANT
GLOVE BIOGEL PI INDICATOR 8 (GLOVE) ×1
GLOVE SS BIOGEL STRL SZ 8 (GLOVE) ×1 IMPLANT
GLOVE SUPERSENSE BIOGEL SZ 8 (GLOVE) ×1
GOWN STRL REUS W/ TWL LRG LVL3 (GOWN DISPOSABLE) ×3 IMPLANT
GOWN STRL REUS W/ TWL XL LVL3 (GOWN DISPOSABLE) ×3 IMPLANT
GOWN STRL REUS W/TWL LRG LVL3 (GOWN DISPOSABLE) ×3
GOWN STRL REUS W/TWL XL LVL3 (GOWN DISPOSABLE) ×3
KIT BASIN OR (CUSTOM PROCEDURE TRAY) ×2 IMPLANT
KIT TURNOVER KIT B (KITS) ×2 IMPLANT
MANIFOLD NEPTUNE II (INSTRUMENTS) IMPLANT
NEEDLE 22X1 1/2 (OR ONLY) (NEEDLE) IMPLANT
NS IRRIG 1000ML POUR BTL (IV SOLUTION) ×2 IMPLANT
PACK ORTHO EXTREMITY (CUSTOM PROCEDURE TRAY) ×2 IMPLANT
PAD ARMBOARD 7.5X6 YLW CONV (MISCELLANEOUS) ×4 IMPLANT
PAD CAST 4YDX4 CTTN HI CHSV (CAST SUPPLIES) ×1 IMPLANT
PADDING CAST COTTON 4X4 STRL (CAST SUPPLIES) ×1
PEG LOCKING SMOOTH 2.2X18 (Peg) ×6 IMPLANT
PEG LOCKING SMOOTH 2.2X20 (Screw) ×6 IMPLANT
PEG LOCKING SMOOTH 2.2X22 (Screw) ×2 IMPLANT
PILLOW ARM CARTER ADULT (MISCELLANEOUS) ×2 IMPLANT
PLATE STD DVR LEFT (Plate) ×2 IMPLANT
PLATE STD DVR LT 24X55 (Plate) ×1 IMPLANT
SCREW LOCK 14X2.7X 3 LD TPR (Screw) ×3 IMPLANT
SCREW LOCKING 2.7X14 (Screw) ×3 IMPLANT
SCREW LOCKING 2.7X15MM (Screw) ×4 IMPLANT
SOL PREP POV-IOD 4OZ 10% (MISCELLANEOUS) ×4 IMPLANT
SPLINT FIBERGLASS 3X12 (CAST SUPPLIES) ×2 IMPLANT
SPONGE LAP 4X18 RFD (DISPOSABLE) IMPLANT
SUT MNCRL AB 4-0 PS2 18 (SUTURE) IMPLANT
SUT PROLENE 3 0 PS 2 (SUTURE) IMPLANT
SUT PROLENE 4 0 PS 2 18 (SUTURE) ×4 IMPLANT
SUT VIC AB 3-0 FS2 27 (SUTURE) ×2 IMPLANT
SYR CONTROL 10ML LL (SYRINGE) IMPLANT
SYSTEM CHEST DRAIN TLS 7FR (DRAIN) IMPLANT
TOWEL GREEN STERILE (TOWEL DISPOSABLE) ×2 IMPLANT
TOWEL GREEN STERILE FF (TOWEL DISPOSABLE) ×2 IMPLANT
TUBE CONNECTING 12X1/4 (SUCTIONS) ×2 IMPLANT
TUBE EVACUATION TLS (MISCELLANEOUS) ×2 IMPLANT
UNDERPAD 30X36 HEAVY ABSORB (UNDERPADS AND DIAPERS) ×2 IMPLANT
WATER STERILE IRR 1000ML POUR (IV SOLUTION) ×2 IMPLANT

## 2020-03-06 NOTE — Discharge Instructions (Signed)
Elevate move and massage her fingers.  Please call for any problems.  We will call you to see Dr. Amanda Pea in 14 days.  Keep bandage clean and dry.  Call for any problems.  No smoking.  Criteria for driving a car: you should be off your pain medicine for 7-8 hours, able to drive one handed(confident), thinking clearly and feeling able in your judgement to drive. Continue elevation as it will decrease swelling.  If instructed by MD move your fingers within the confines of the bandage/splint.  Use ice if instructed by your MD. Call immediately for any sudden loss of feeling in your hand/arm or change in functional abilities of the extremity.We recommend that you to take vitamin C 1000 mg a day to promote healing. We also recommend that if you require  pain medicine that you take a stool softener to prevent constipation as most pain medicines will have constipation side effects. We recommend either Peri-Colace or Senokot and recommend that you also consider adding MiraLAX as well to prevent the constipation affects from pain medicine if you are required to use them. These medicines are over the counter and may be purchased at a local pharmacy. A cup of yogurt and a probiotic can also be helpful during the recovery process as the medicines can disrupt your intestinal environment.

## 2020-03-06 NOTE — H&P (Signed)
Kenya L Ahart is an 49 y.o. female.   Chief Complaint: Patient presents with a displaced intra-articular greater than 3 part distal radius fracture with angulation and a volar Barton fracture pattern.  We will plan for ORIF HPI: Patient presents for evaluation and treatment of the of their upper extremity predicament. The patient denies neck, back, chest or  abdominal pain. The patient notes that they have no lower extremity problems. The patients primary complaint is noted. We are planning surgical care pathway for the upper extremity.  No past medical history on file.  Past Surgical History:  Procedure Laterality Date  . BACK SURGERY      Family History  Problem Relation Age of Onset  . Breast cancer Maternal Aunt 50  . Breast cancer Maternal Aunt 81   Social History:  reports that she has never smoked. She has never used smokeless tobacco. She reports that she does not drink alcohol or use drugs.  Allergies:  Allergies  Allergen Reactions  . Ciprofloxacin Nausea Only  . Sulfa Antibiotics Nausea Only    No medications prior to admission.    No results found for this or any previous visit (from the past 48 hour(s)). DG Wrist Complete Left  Result Date: 03/04/2020 CLINICAL DATA:  Left wrist pain after fall. Tripped on steps today. EXAM: LEFT WRIST - COMPLETE 3+ VIEW COMPARISON:  None. FINDINGS: Displaced and comminuted distal radius fracture involving the volar articular surface. Osseous distraction of approximately 5 mm. Articular distraction of at least 3 mm. There is displacement of the radial styloid about the the volar wrist. Carpal bones remain aligned with the dominant radial fragment. Minimally displaced ulna styloid fracture. Scaphoid and carpal bones are intact. Soft tissue edema. IMPRESSION: 1. Displaced and comminuted distal radius fracture primarily involving the radial styloid without osseous distraction at the radiocarpal joint. 2. Minimally displaced ulna styloid  fracture. Electronically Signed   By: Narda Rutherford M.D.   On: 03/04/2020 22:02    Review of Systems  There were no vitals taken for this visit. Physical Exam  Left distal radius fracture displaced angulated and intra-articular with a volar Barton's fracture pattern.  She is sensate has no signs of carpal tunnel syndrome and is stable to elbow and upper arm exam.  Right upper extremity is neurovascularly intact and without deformity or pain or problems.    The patient is alert and oriented in no acute distress. The patient complains of pain in the affected upper extremity.  The patient is noted to have a normal HEENT exam. Lung fields show equal chest expansion and no shortness of breath. Abdomen exam is nontender without distention. Lower extremity examination does not show any fracture dislocation or blood clot symptoms. Pelvis is stable and the neck and back are stable and nontender. Assessment/Plan We will plan for open reduction internal fixation left intra-articular fracture with repair reconstruction is necessary.  Patient understands risk and benefits and will proceed with surgical reconstruction of her left distal radius.  We are planning surgery for your upper extremity. The risk and benefits of surgery to include risk of bleeding, infection, anesthesia,  damage to normal structures and failure of the surgery to accomplish its intended goals of relieving symptoms and restoring function have been discussed in detail. With this in mind we plan to proceed. I have specifically discussed with the patient the pre-and postoperative regime and the dos and don'ts and risk and benefits in great detail. Risk and benefits of surgery also include risk  of dystrophy(CRPS), chronic nerve pain, failure of the healing process to go onto completion and other inherent risks of surgery The relavent the pathophysiology of the disease/injury process, as well as the alternatives for treatment and  postoperative course of action has been discussed in great detail with the patient who desires to proceed.  We will do everything in our power to help you (the patient) restore function to the upper extremity. It is a pleasure to see this patient today.   Willa Frater III, MD 03/06/2020, 3:12 PM

## 2020-03-06 NOTE — Anesthesia Procedure Notes (Signed)
Anesthesia Regional Block: Axillary brachial plexus block   Pre-Anesthetic Checklist: ,, timeout performed, Correct Patient, Correct Site, Correct Laterality, Correct Procedure, Correct Position, site marked, Risks and benefits discussed,  Surgical consent,  Pre-op evaluation,  At surgeon's request and post-op pain management  Laterality: Left and Upper  Prep: chloraprep       Needles:  Injection technique: Single-shot     Needle Length: 9cm  Needle Gauge: 22     Additional Needles: Arrow StimuQuik ECHO Echogenic Stimulating PNB Needle  Procedures:,,,, ultrasound used (permanent image in chart),,,,  Narrative:  Start time: 03/06/2020 6:04 PM End time: 03/06/2020 6:11 PM Injection made incrementally with aspirations every 5 mL.  Performed by: Personally  Anesthesiologist: Val Eagle, MD

## 2020-03-06 NOTE — Op Note (Signed)
Operative note  Dominica Severin MD  Preoperative diagnosis: Left distal radius fracture comminuted complex greater than 3 part intra-articular volar Barton's fracture configuration  Postop diagnosis: Same  Procedure: #1 left volar Barton's fracture open reduction internal fixation comminuted complex distal radius fracture with DVR [employer] cross lock plate and screw construct.  This was a greater than 3 part intra-articular fracture.  #2 AP lateral and oblique x-rays performed examined and interpreted by myself  Donny Heffern MD  Anesthesia: Block with IV sedation  Estimated blood loss minimal  Complications none immediate  Operative indications the patient presents for evaluation and surgical care.  Patient understands risk benefits and desires to proceed.  We have discussed with the patient all issues plans and concerns with this in mind we will proceed accordingly. We are planning surgery for your upper extremity. The risk and benefits of surgery to include risk of bleeding, infection, anesthesia,  damage to normal structures and failure of the surgery to accomplish its intended goals of relieving symptoms and restoring function have been discussed in detail. With this in mind we plan to proceed. I have specifically discussed with the patient the pre-and postoperative regime and the dos and don'ts and risk and benefits in great detail. Risk and benefits of surgery also include risk of dystrophy(CRPS), chronic nerve pain, failure of the healing process to go onto completion and other inherent risks of surgery The relavent the pathophysiology of the disease/injury process, as well as the alternatives for treatment and postoperative course of action has been discussed in great detail with the patient who desires to proceed.  We will do everything in our power to help you (the patient) restore function to the upper extremity. It is a pleasure to see this patient today.    Operative procedure: Patient  was seen by myself and anesthesia.  Appropriate anesthesia was induced and following this the patient was prepped with a Hibiclens pre-scrub followed by 10-minute surgical Betadine scrub and paint.  Once this was completed the extremity was elevated and the tourniquet was insufflated to 250 mmHg.  Timeout was observed preoperative antibiotics were given and the patient then underwent a very careful and cautious approach to the extremity with volar radial incision under 250 mm tourniquet control.  FCR tendon sheath was identified and dissected.  There were no complicating features.  Once this was completed the carpal canal contents were retracted ulnarly and the FCR was retracted radially.  We took very meticulous care of the radial artery and the carpal canal contents during the approach.  The pronator was accessed incised and lifted off of the fracture.  The fracture was then reassembled with standard orthopedic equipment and a DVR plate and screw construct from [employer] was accomplished in terms of placement and fixation of the fracture. I should note this was a volar rim plate which harnessed the displaced fracture fragments. Adequate radial height, volar tilt and radial inclination was restored.  The distal radial ulnar joint, radiocarpal and midcarpal joints all were  stable and satisfactory.  We irrigated copiously and closed the pronator with 3-0 Vicryl followed by closure of the skin edge with Prolene.  Once again, the distal radius underwent open reduction internal fixation without complications.  The distal radial ulnar joint was stable.  The patient had no complications.  All radiographic parameters look quite well following the fixation.  Standard dressing of Adaptic Xeroform 4 x 4's gauze web roll Kerlix and a volar splint were applied.  The patient understands instructions  of elevate move massage fingers notify us any problems occur and follow-up care according to our standard protocol for  a DVR plate and screw construct.  He has been a pleasure participate in the patient's care and we look forward to spent in the patient's recovery. Patient did wonderful with her surgery I was quite pleased with her stability postop.  Should be discharged home on Keflex oxycodone Robaxin and Zofran.  I discussed all issues with her husband.  Pleasure to see her.  She will rehab according to our standard DVR protocol. Roseanne Kaufman MD

## 2020-03-06 NOTE — Anesthesia Procedure Notes (Signed)
Procedure Name: MAC Date/Time: 03/06/2020 7:06 PM Performed by: Valetta Fuller, CRNA Pre-anesthesia Checklist: Patient identified, Emergency Drugs available, Suction available, Patient being monitored and Timeout performed Patient Re-evaluated:Patient Re-evaluated prior to induction Oxygen Delivery Method: Simple face mask

## 2020-03-06 NOTE — Anesthesia Preprocedure Evaluation (Addendum)
Anesthesia Evaluation  Patient identified by MRN, date of birth, ID band Patient awake    Reviewed: Allergy & Precautions, NPO status , Patient's Chart, lab work & pertinent test results  History of Anesthesia Complications Negative for: history of anesthetic complications  Airway Mallampati: II  TM Distance: >3 FB Neck ROM: Full    Dental  (+) Dental Advisory Given, Teeth Intact   Pulmonary neg pulmonary ROS,  covid + 12/2019   breath sounds clear to auscultation       Cardiovascular negative cardio ROS   Rhythm:Regular     Neuro/Psych  Neuromuscular disease    GI/Hepatic Neg liver ROS, GERD  Medicated and Controlled,  Endo/Other  Morbid obesity  Renal/GU negative Renal ROS     Musculoskeletal  (+) Arthritis , Left displased interarticular distal radius fracture greater than 3 part   Abdominal   Peds  Hematology   Anesthesia Other Findings   Reproductive/Obstetrics                            Anesthesia Physical Anesthesia Plan  ASA: II  Anesthesia Plan: MAC and Regional   Post-op Pain Management:    Induction:   PONV Risk Score and Plan: 2 and Treatment may vary due to age or medical condition and Propofol infusion  Airway Management Planned: Nasal Cannula  Additional Equipment: None  Intra-op Plan:   Post-operative Plan:   Informed Consent: I have reviewed the patients History and Physical, chart, labs and discussed the procedure including the risks, benefits and alternatives for the proposed anesthesia with the patient or authorized representative who has indicated his/her understanding and acceptance.     Dental advisory given  Plan Discussed with: CRNA and Surgeon  Anesthesia Plan Comments:         Anesthesia Quick Evaluation

## 2020-03-07 ENCOUNTER — Encounter: Payer: Self-pay | Admitting: *Deleted

## 2020-03-07 NOTE — Transfer of Care (Signed)
Immediate Anesthesia Transfer of Care Note  Patient: Angela Osborn  Procedure(s) Performed: left volar Barton's fracture open reduction internal fixation comminuted complex distal radius fracture with DVR [employer] cross lock plate and screw    AP lateral and oblique x-rays performed (Left )  Patient Location: PACU  Anesthesia Type:MAC combined with regional for post-op pain  Level of Consciousness: awake, alert , oriented and patient cooperative  Airway & Oxygen Therapy: Patient Spontanous Breathing  Post-op Assessment: Report given to RN and Post -op Vital signs reviewed and stable  Post vital signs: Reviewed and stable  Last Vitals:  Vitals Value Taken Time  BP 128/77 03/06/20 2034  Temp 36.2 C 03/06/20 2030  Pulse 82 03/06/20 2044  Resp 21 03/06/20 2044  SpO2 100 % 03/06/20 2044  Vitals shown include unvalidated device data.  Last Pain:  Vitals:   03/06/20 2030  TempSrc:   PainSc: 0-No pain      Patients Stated Pain Goal: 3 (20/35/59 7416)  Complications: No apparent anesthesia complications

## 2020-03-08 ENCOUNTER — Encounter: Payer: Self-pay | Admitting: Anesthesiology

## 2020-03-08 NOTE — Anesthesia Postprocedure Evaluation (Signed)
Anesthesia Post Note  Patient: Elianne Damali Broadfoot  Procedure(s) Performed: left volar Barton's fracture open reduction internal fixation comminuted complex distal radius fracture with DVR [employer] cross lock plate and screw    AP lateral and oblique x-rays performed (Left )     Patient location during evaluation: PACU Anesthesia Type: Regional and MAC Level of consciousness: awake and alert Pain management: pain level controlled Vital Signs Assessment: post-procedure vital signs reviewed and stable Respiratory status: spontaneous breathing, nonlabored ventilation, respiratory function stable and patient connected to nasal cannula oxygen Cardiovascular status: stable and blood pressure returned to baseline Postop Assessment: no apparent nausea or vomiting Anesthetic complications: no    Last Vitals:  Vitals:   03/06/20 2015 03/06/20 2030  BP: 115/78 128/77  Pulse: 80 83  Resp: 18 18  Temp:  (!) 36.2 C  SpO2: 97% 100%    Last Pain:  Vitals:   03/06/20 2030  TempSrc:   PainSc: 0-No pain                 Manasvini Whatley

## 2020-03-21 DIAGNOSIS — M25531 Pain in right wrist: Secondary | ICD-10-CM | POA: Diagnosis not present

## 2020-03-21 DIAGNOSIS — S52562D Barton's fracture of left radius, subsequent encounter for closed fracture with routine healing: Secondary | ICD-10-CM | POA: Diagnosis not present

## 2020-03-21 DIAGNOSIS — M25529 Pain in unspecified elbow: Secondary | ICD-10-CM | POA: Insufficient documentation

## 2020-03-21 DIAGNOSIS — M25532 Pain in left wrist: Secondary | ICD-10-CM | POA: Diagnosis not present

## 2020-03-21 DIAGNOSIS — M25522 Pain in left elbow: Secondary | ICD-10-CM | POA: Diagnosis not present

## 2020-03-21 DIAGNOSIS — Z4789 Encounter for other orthopedic aftercare: Secondary | ICD-10-CM | POA: Insufficient documentation

## 2020-03-23 DIAGNOSIS — W19XXXA Unspecified fall, initial encounter: Secondary | ICD-10-CM | POA: Diagnosis not present

## 2020-03-23 DIAGNOSIS — M25552 Pain in left hip: Secondary | ICD-10-CM | POA: Diagnosis not present

## 2020-03-23 DIAGNOSIS — M5136 Other intervertebral disc degeneration, lumbar region: Secondary | ICD-10-CM | POA: Diagnosis not present

## 2020-03-23 DIAGNOSIS — M545 Low back pain: Secondary | ICD-10-CM | POA: Diagnosis not present

## 2020-03-28 DIAGNOSIS — Z4789 Encounter for other orthopedic aftercare: Secondary | ICD-10-CM | POA: Diagnosis not present

## 2020-03-28 DIAGNOSIS — S52562D Barton's fracture of left radius, subsequent encounter for closed fracture with routine healing: Secondary | ICD-10-CM | POA: Diagnosis not present

## 2020-04-04 DIAGNOSIS — M25632 Stiffness of left wrist, not elsewhere classified: Secondary | ICD-10-CM | POA: Insufficient documentation

## 2020-04-04 DIAGNOSIS — S52562D Barton's fracture of left radius, subsequent encounter for closed fracture with routine healing: Secondary | ICD-10-CM | POA: Diagnosis not present

## 2020-04-04 DIAGNOSIS — Z4789 Encounter for other orthopedic aftercare: Secondary | ICD-10-CM | POA: Diagnosis not present

## 2020-04-11 DIAGNOSIS — M25632 Stiffness of left wrist, not elsewhere classified: Secondary | ICD-10-CM | POA: Diagnosis not present

## 2020-04-11 DIAGNOSIS — H524 Presbyopia: Secondary | ICD-10-CM | POA: Diagnosis not present

## 2020-04-16 DIAGNOSIS — M25632 Stiffness of left wrist, not elsewhere classified: Secondary | ICD-10-CM | POA: Diagnosis not present

## 2020-04-18 DIAGNOSIS — M25512 Pain in left shoulder: Secondary | ICD-10-CM | POA: Diagnosis not present

## 2020-04-19 DIAGNOSIS — M25632 Stiffness of left wrist, not elsewhere classified: Secondary | ICD-10-CM | POA: Diagnosis not present

## 2020-04-25 DIAGNOSIS — M25632 Stiffness of left wrist, not elsewhere classified: Secondary | ICD-10-CM | POA: Diagnosis not present

## 2020-05-02 DIAGNOSIS — M25531 Pain in right wrist: Secondary | ICD-10-CM | POA: Diagnosis not present

## 2020-05-02 DIAGNOSIS — Z4789 Encounter for other orthopedic aftercare: Secondary | ICD-10-CM | POA: Diagnosis not present

## 2020-05-02 DIAGNOSIS — M25632 Stiffness of left wrist, not elsewhere classified: Secondary | ICD-10-CM | POA: Diagnosis not present

## 2020-05-02 DIAGNOSIS — S52562D Barton's fracture of left radius, subsequent encounter for closed fracture with routine healing: Secondary | ICD-10-CM | POA: Diagnosis not present

## 2020-05-04 DIAGNOSIS — M25632 Stiffness of left wrist, not elsewhere classified: Secondary | ICD-10-CM | POA: Diagnosis not present

## 2020-05-08 DIAGNOSIS — E559 Vitamin D deficiency, unspecified: Secondary | ICD-10-CM | POA: Diagnosis not present

## 2020-05-08 DIAGNOSIS — K219 Gastro-esophageal reflux disease without esophagitis: Secondary | ICD-10-CM | POA: Diagnosis not present

## 2020-05-08 DIAGNOSIS — Z23 Encounter for immunization: Secondary | ICD-10-CM | POA: Diagnosis not present

## 2020-05-08 DIAGNOSIS — E785 Hyperlipidemia, unspecified: Secondary | ICD-10-CM | POA: Insufficient documentation

## 2020-05-08 DIAGNOSIS — E669 Obesity, unspecified: Secondary | ICD-10-CM | POA: Diagnosis not present

## 2020-05-08 DIAGNOSIS — R030 Elevated blood-pressure reading, without diagnosis of hypertension: Secondary | ICD-10-CM | POA: Insufficient documentation

## 2020-05-08 DIAGNOSIS — Z1211 Encounter for screening for malignant neoplasm of colon: Secondary | ICD-10-CM | POA: Diagnosis not present

## 2020-05-08 DIAGNOSIS — W450XXA Nail entering through skin, initial encounter: Secondary | ICD-10-CM | POA: Diagnosis not present

## 2020-05-08 DIAGNOSIS — S91332A Puncture wound without foreign body, left foot, initial encounter: Secondary | ICD-10-CM | POA: Diagnosis not present

## 2020-05-09 DIAGNOSIS — M25512 Pain in left shoulder: Secondary | ICD-10-CM | POA: Diagnosis not present

## 2020-05-16 DIAGNOSIS — M25632 Stiffness of left wrist, not elsewhere classified: Secondary | ICD-10-CM | POA: Diagnosis not present

## 2020-05-21 DIAGNOSIS — M25512 Pain in left shoulder: Secondary | ICD-10-CM | POA: Diagnosis not present

## 2020-05-24 DIAGNOSIS — M25632 Stiffness of left wrist, not elsewhere classified: Secondary | ICD-10-CM | POA: Diagnosis not present

## 2020-05-31 DIAGNOSIS — M25632 Stiffness of left wrist, not elsewhere classified: Secondary | ICD-10-CM | POA: Diagnosis not present

## 2020-06-05 DIAGNOSIS — S52562D Barton's fracture of left radius, subsequent encounter for closed fracture with routine healing: Secondary | ICD-10-CM | POA: Diagnosis not present

## 2020-06-05 DIAGNOSIS — M25632 Stiffness of left wrist, not elsewhere classified: Secondary | ICD-10-CM | POA: Diagnosis not present

## 2020-09-24 ENCOUNTER — Other Ambulatory Visit: Payer: Self-pay | Admitting: Obstetrics and Gynecology

## 2020-09-24 DIAGNOSIS — Z1231 Encounter for screening mammogram for malignant neoplasm of breast: Secondary | ICD-10-CM

## 2020-10-10 ENCOUNTER — Ambulatory Visit
Admission: RE | Admit: 2020-10-10 | Discharge: 2020-10-10 | Disposition: A | Discharge status: Home / Self Care | Payer: BC Managed Care – PPO | Source: Ambulatory Visit | Attending: Obstetrics and Gynecology | Admitting: Obstetrics and Gynecology

## 2020-10-10 ENCOUNTER — Other Ambulatory Visit: Payer: Self-pay

## 2020-10-10 DIAGNOSIS — Z1231 Encounter for screening mammogram for malignant neoplasm of breast: Secondary | ICD-10-CM | POA: Insufficient documentation

## 2022-06-26 IMAGING — MG DIGITAL SCREENING BILAT W/ TOMO W/ CAD
6 of 10 series · 6 of 30 positions shown · non-contrast
Comparison: Previous exam(s).

CLINICAL DATA: Screening.

EXAM:
DIGITAL SCREENING BILATERAL MAMMOGRAM WITH TOMO AND CAD

[L CC synth-2D]
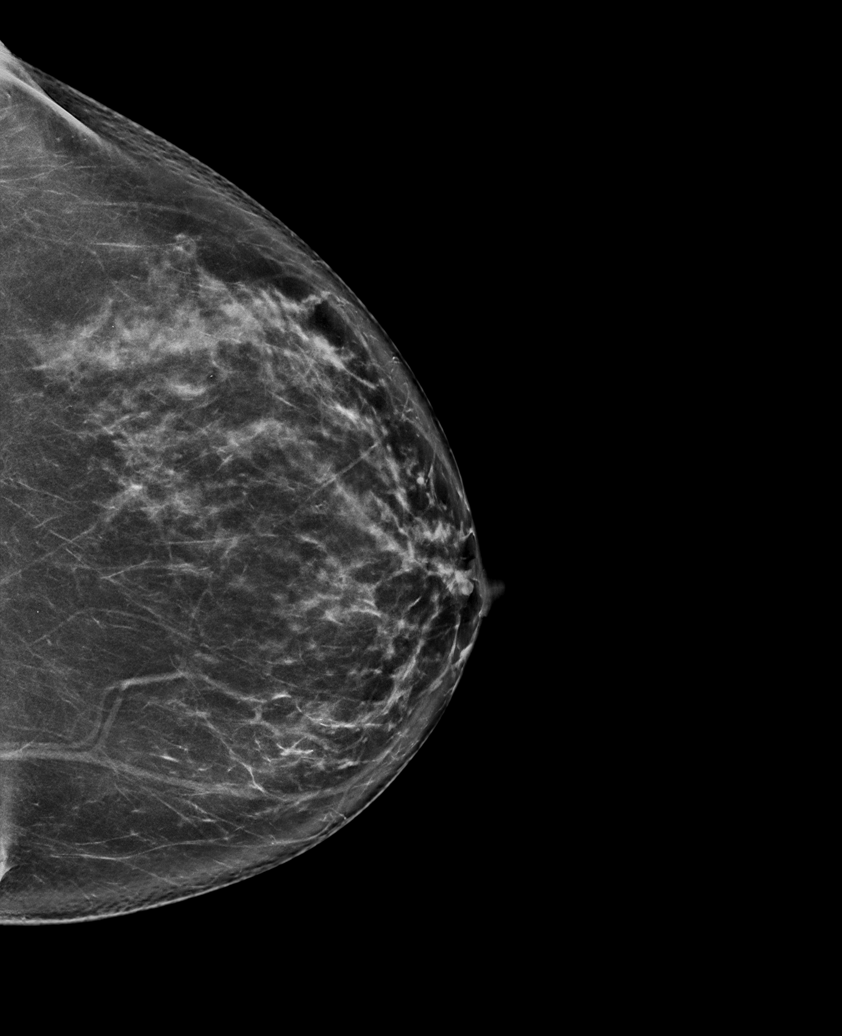

[L MLO synth-2D]
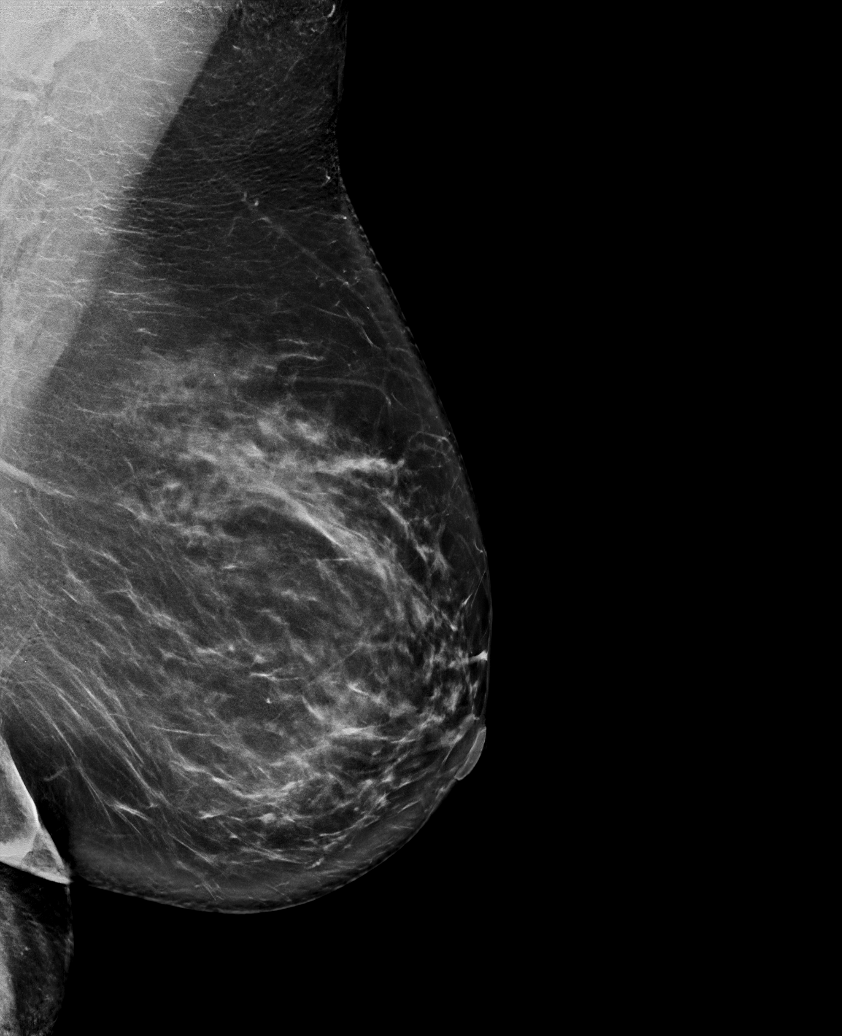

[R MLO synth-2D]
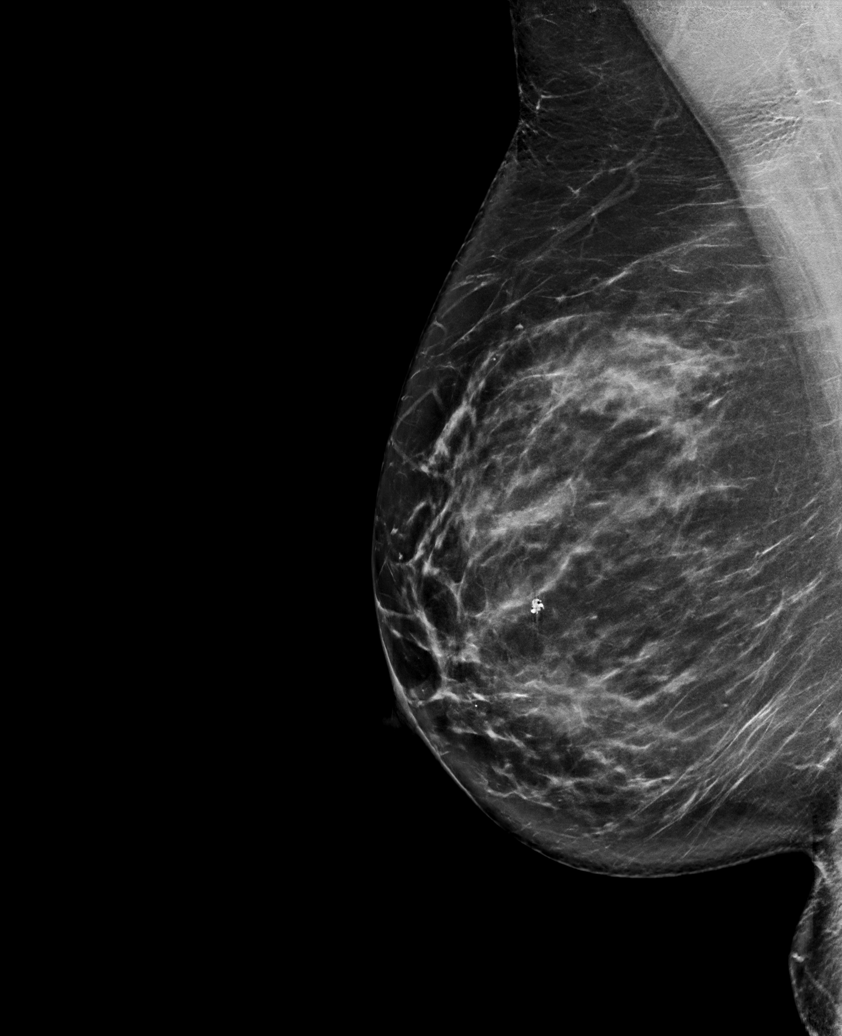

[R CC synth-2D (1 of 2)]
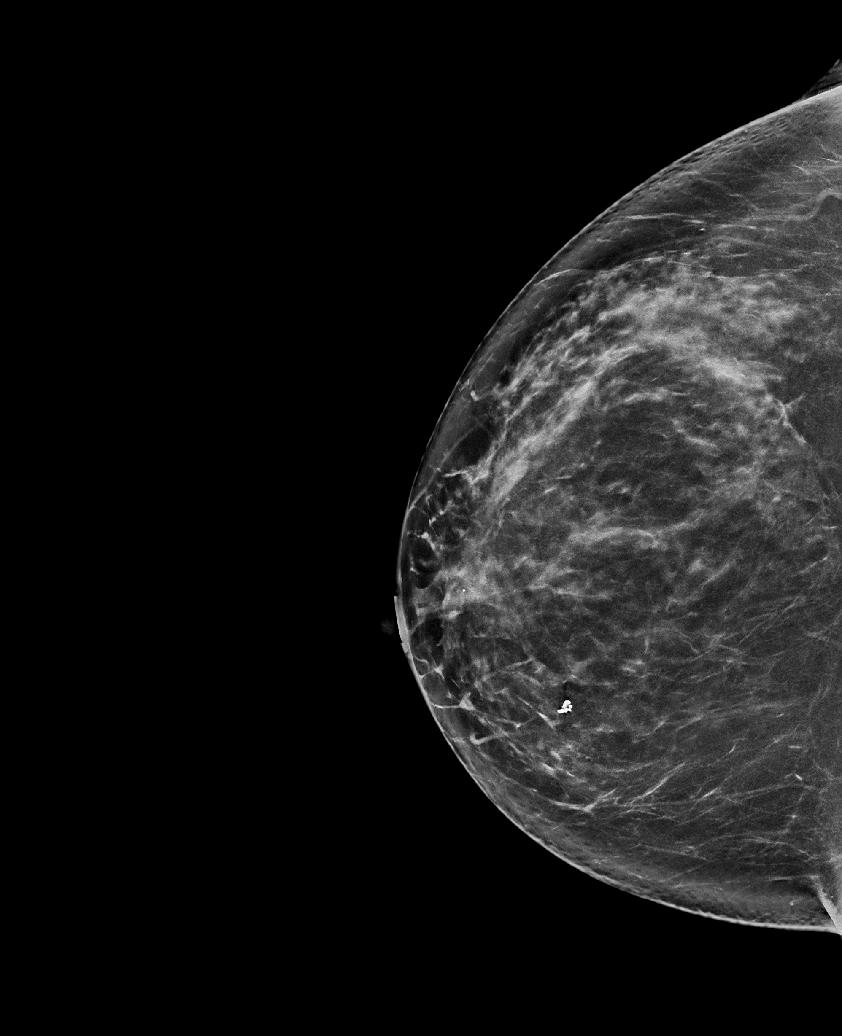

[R CC synth-2D (2 of 2)]
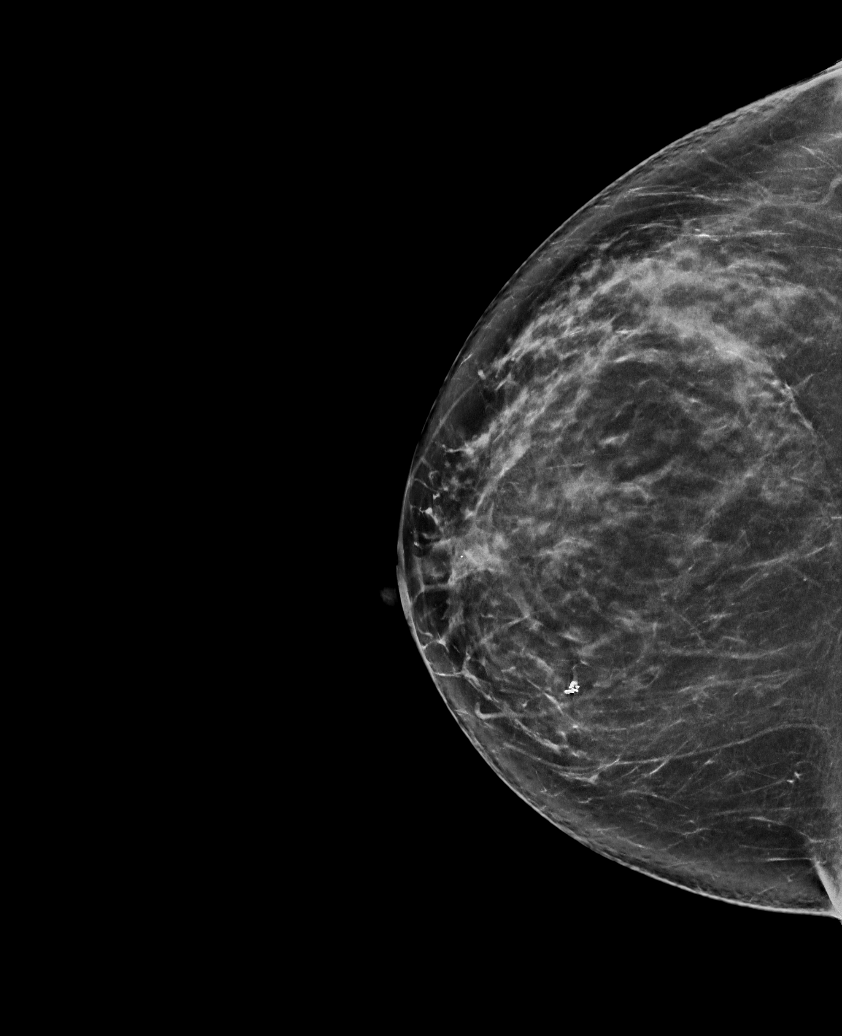

[R CC tomo · tomo slice 41/81.0]
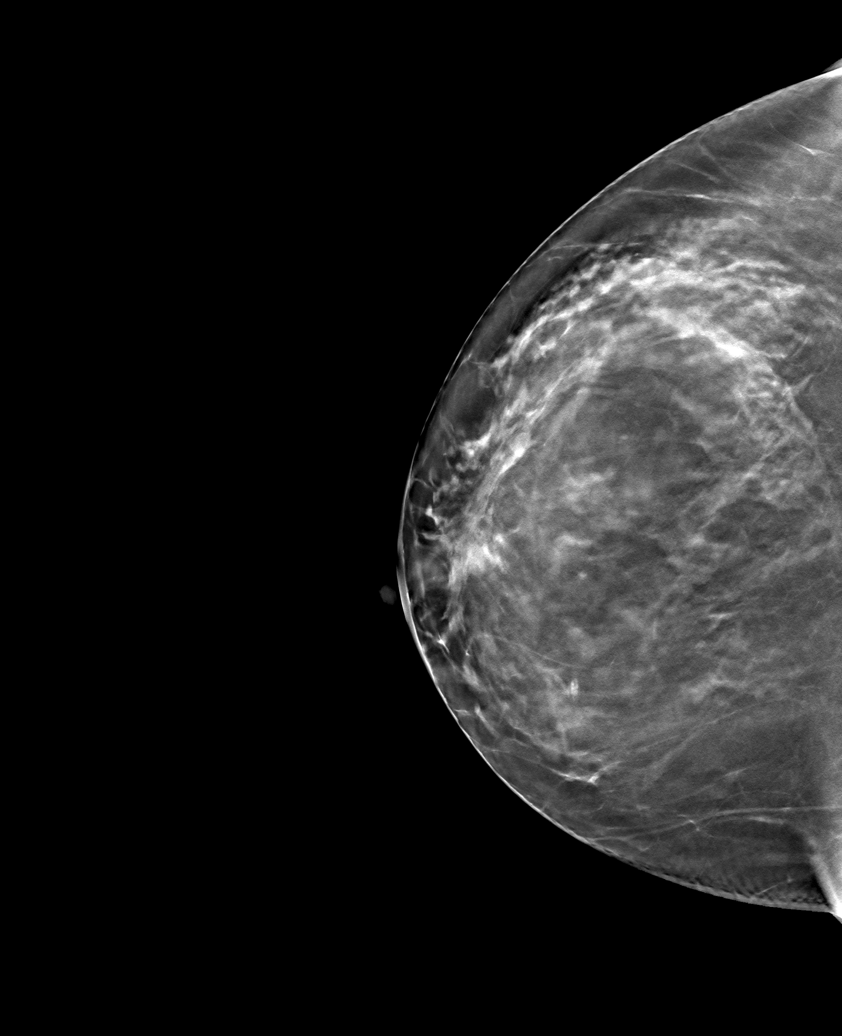

[6 of 30 positions shown; findings below may reference images not displayed]

ACR Breast Density Category c: The breast tissue is heterogeneously
dense, which may obscure small masses.
FINDINGS: There are no findings suspicious for malignancy. Images were
processed with CAD.
IMPRESSION: No mammographic evidence of malignancy. A result letter of this
screening mammogram will be mailed directly to the patient.

RECOMMENDATION:
Screening mammogram in one year. (Code:[id number])

BI-RADS CATEGORY  1: Negative.

## 2022-10-07 ENCOUNTER — Encounter: Payer: Self-pay | Admitting: *Deleted

## 2022-10-07 ENCOUNTER — Other Ambulatory Visit: Payer: Self-pay

## 2022-10-07 ENCOUNTER — Emergency Department: Payer: BC Managed Care – PPO

## 2022-10-07 ENCOUNTER — Emergency Department
Admission: EM | Admit: 2022-10-07 | Discharge: 2022-10-07 | Disposition: A | Discharge status: Home / Self Care | Payer: BC Managed Care – PPO | Attending: Emergency Medicine | Admitting: Emergency Medicine

## 2022-10-07 DIAGNOSIS — S92312A Displaced fracture of first metatarsal bone, left foot, initial encounter for closed fracture: Secondary | ICD-10-CM | POA: Diagnosis not present

## 2022-10-07 DIAGNOSIS — S92322A Displaced fracture of second metatarsal bone, left foot, initial encounter for closed fracture: Secondary | ICD-10-CM

## 2022-10-07 DIAGNOSIS — S92212A Displaced fracture of cuboid bone of left foot, initial encounter for closed fracture: Secondary | ICD-10-CM | POA: Diagnosis not present

## 2022-10-07 DIAGNOSIS — Y92410 Unspecified street and highway as the place of occurrence of the external cause: Secondary | ICD-10-CM | POA: Insufficient documentation

## 2022-10-07 DIAGNOSIS — M79672 Pain in left foot: Secondary | ICD-10-CM | POA: Insufficient documentation

## 2022-10-07 DIAGNOSIS — S39012A Strain of muscle, fascia and tendon of lower back, initial encounter: Secondary | ICD-10-CM | POA: Diagnosis not present

## 2022-10-07 DIAGNOSIS — S99922A Unspecified injury of left foot, initial encounter: Secondary | ICD-10-CM | POA: Diagnosis present

## 2022-10-07 DIAGNOSIS — S92342A Displaced fracture of fourth metatarsal bone, left foot, initial encounter for closed fracture: Secondary | ICD-10-CM

## 2022-10-07 DIAGNOSIS — R0789 Other chest pain: Secondary | ICD-10-CM | POA: Diagnosis not present

## 2022-10-07 DIAGNOSIS — S92232A Displaced fracture of intermediate cuneiform of left foot, initial encounter for closed fracture: Secondary | ICD-10-CM | POA: Diagnosis not present

## 2022-10-07 LAB — BASIC METABOLIC PANEL
Anion gap: 9 (ref 5–15)
BUN: 18 mg/dL (ref 6–20)
CO2: 26 mmol/L (ref 22–32)
Calcium: 9.6 mg/dL (ref 8.9–10.3)
Chloride: 104 mmol/L (ref 98–111)
Creatinine, Ser: 0.74 mg/dL (ref 0.44–1.00)
GFR, Estimated: >60 mL/min (ref >60)
Glucose, Bld: 113 mg/dL — ABNORMAL HIGH (ref 70–99)
Potassium: 3.8 mmol/L (ref 3.5–5.1)
Sodium: 139 mmol/L (ref 135–145)

## 2022-10-07 LAB — CBC
HCT: 40.2 % (ref 36.0–46.0)
Hemoglobin: 13 g/dL (ref 12.0–15.0)
MCH: 29.1 pg (ref 26.0–34.0)
MCHC: 32.3 g/dL (ref 30.0–36.0)
MCV: 90.1 fL (ref 80.0–100.0)
Platelets: 259 10*3/uL (ref 150–400)
RBC: 4.46 MIL/uL (ref 3.87–5.11)
RDW: 12.6 % (ref 11.5–15.5)
WBC: 7.8 10*3/uL (ref 4.0–10.5)
nRBC: 0 % (ref 0.0–0.2)

## 2022-10-07 LAB — TROPONIN I (HIGH SENSITIVITY)
Troponin I (High Sensitivity): 3 ng/L (ref <18)
Troponin I (High Sensitivity): 6 ng/L (ref <18)

## 2022-10-07 MED ORDER — METHOCARBAMOL 1000 MG/10ML IJ SOLN: 500.0000 mg | Freq: Once | INTRAMUSCULAR | Status: DC

## 2022-10-07 MED ORDER — CYCLOBENZAPRINE HCL 5 MG PO TABS: 5.0000 mg | ORAL_TABLET | Freq: Three times a day (TID) | ORAL | 0 refills | Status: DC | PRN

## 2022-10-07 MED ORDER — OXYCODONE HCL 5 MG PO TABS: 5.0000 mg | ORAL_TABLET | ORAL | 0 refills | Status: DC | PRN

## 2022-10-07 MED ORDER — IOHEXOL 300 MG/ML  SOLN
75.0000 mL | Freq: Once | INTRAMUSCULAR | Status: AC | PRN
  Administered 2022-10-07: 75 mL via INTRAVENOUS

## 2022-10-07 MED ORDER — METHOCARBAMOL 1000 MG/10ML IJ SOLN
500.0000 mg | Freq: Once | INTRAVENOUS | Status: AC
  Administered 2022-10-07: 500 mg via INTRAVENOUS
  Filled 2022-10-07: qty 5

## 2022-10-07 MED ORDER — OXYCODONE-ACETAMINOPHEN 5-325 MG PO TABS
1.0000 | ORAL_TABLET | Freq: Once | ORAL | Status: AC
  Administered 2022-10-07: 1 via ORAL
  Filled 2022-10-07: qty 1

## 2022-10-07 MED ORDER — MORPHINE SULFATE (PF) 4 MG/ML IV SOLN
4.0000 mg | Freq: Once | INTRAVENOUS | Status: AC
  Administered 2022-10-07: 4 mg via INTRAVENOUS
  Filled 2022-10-07: qty 1

## 2022-10-07 MED ORDER — KETOROLAC TROMETHAMINE 30 MG/ML IJ SOLN
30.0000 mg | Freq: Once | INTRAMUSCULAR | Status: AC
  Administered 2022-10-07: 30 mg via INTRAVENOUS
  Filled 2022-10-07: qty 1

## 2022-10-07 MED ORDER — CELECOXIB 200 MG PO CAPS: 200.0000 mg | ORAL_CAPSULE | Freq: Two times a day (BID) | ORAL | 0 refills | Status: AC
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
## 2022-10-07 NOTE — ED Notes (Signed)
Pt Dc to home. Dc instructions reviewed with all questions answered. Pt verbalizes understanding. IV removed, cath intact, pressure dressing applied. No bleeding noted at site Pt assisted out of dept via wheelchair with family member

## 2022-10-07 NOTE — ED Notes (Signed)
Received call from radiology that pt also c/o pain to left foot and ankle, with inability to bear weight. Orders for additional imaging placed appropriately
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
## 2022-10-07 NOTE — Discharge Instructions (Addendum)
Please apply ice to the foot, lower back and chest wall 20 minutes every hour as needed for comfort.  Take Celebrex, Tylenol for mild to moderate pain and you may use oxycodone as needed for severe pain.  Use Flexeril as needed for muscle tightness/spasms.  Use walking boot and crutches to ambulate, no weightbearing on left lower extremity until you follow-up with podiatrist.  Return to the ER for any chest pain shortness of breath worsening symptoms or any urgent changes in your health
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
## 2022-10-08 ENCOUNTER — Telehealth: Payer: Self-pay | Admitting: Emergency Medicine

## 2022-10-08 MED ORDER — OXYCODONE HCL 5 MG PO TABS: 5.0000 mg | ORAL_TABLET | ORAL | 0 refills | Status: AC | PRN

## 2022-10-08 NOTE — Telephone Encounter (Signed)
Patient unable to fill at [facility] -called into [facility]

## 2022-10-14 DIAGNOSIS — S93325A Dislocation of tarsometatarsal joint of left foot, initial encounter: Secondary | ICD-10-CM | POA: Insufficient documentation

## 2023-01-14 DIAGNOSIS — M79672 Pain in left foot: Secondary | ICD-10-CM | POA: Insufficient documentation

## 2023-02-12 ENCOUNTER — Other Ambulatory Visit (HOSPITAL_BASED_OUTPATIENT_CLINIC_OR_DEPARTMENT_OTHER): Payer: Self-pay | Admitting: Internal Medicine

## 2023-02-12 DIAGNOSIS — Z1231 Encounter for screening mammogram for malignant neoplasm of breast: Secondary | ICD-10-CM

## 2023-08-12 DIAGNOSIS — T849XXA Unspecified complication of internal orthopedic prosthetic device, implant and graft, initial encounter: Secondary | ICD-10-CM | POA: Insufficient documentation

## 2023-08-13 ENCOUNTER — Other Ambulatory Visit (HOSPITAL_COMMUNITY): Payer: Self-pay | Admitting: Orthopedic Surgery

## 2023-08-20 ENCOUNTER — Encounter (HOSPITAL_BASED_OUTPATIENT_CLINIC_OR_DEPARTMENT_OTHER): Payer: Self-pay | Admitting: Orthopedic Surgery

## 2023-08-21 ENCOUNTER — Encounter (HOSPITAL_BASED_OUTPATIENT_CLINIC_OR_DEPARTMENT_OTHER)
Admission: RE | Admit: 2023-08-21 | Discharge: 2023-08-21 | Disposition: A | Discharge status: Home / Self Care | Payer: BC Managed Care – PPO | Source: Ambulatory Visit | Attending: Orthopedic Surgery | Admitting: Orthopedic Surgery

## 2023-08-21 DIAGNOSIS — Z01810 Encounter for preprocedural cardiovascular examination: Secondary | ICD-10-CM | POA: Insufficient documentation

## 2023-08-21 NOTE — Progress Notes (Signed)

## 2023-08-26 NOTE — Anesthesia Preprocedure Evaluation (Signed)
Anesthesia Evaluation  Patient identified by MRN, date of birth, ID band Patient awake    Reviewed: Allergy & Precautions, NPO status , Patient's Chart, lab work & pertinent test results  History of Anesthesia Complications (+) PONV and history of anesthetic complications  Airway Mallampati: II  TM Distance: >3 FB Neck ROM: Full    Dental  (+) Dental Advisory Given, Teeth Intact   Pulmonary neg pulmonary ROS covid + 12/2019   breath sounds clear to auscultation       Cardiovascular hypertension, Pt. on medications negative cardio ROS  Rhythm:Regular     Neuro/Psych  Neuromuscular disease    GI/Hepatic Neg liver ROS,GERD  Medicated and Controlled,,  Endo/Other    Morbid obesity  Renal/GU negative Renal ROS     Musculoskeletal  (+) Arthritis ,  Left displased interarticular distal radius fracture greater than 3 part   Abdominal   Peds  Hematology   Anesthesia Other Findings   Reproductive/Obstetrics                             Anesthesia Physical Anesthesia Plan  ASA: 2  Anesthesia Plan: General   Post-op Pain Management: Tylenol PO (pre-op)*, Celebrex PO (pre-op)* and Minimal or no pain anticipated   Induction: Intravenous  PONV Risk Score and Plan: 2 and Ondansetron, Dexamethasone and Treatment may vary due to age or medical condition  Airway Management Planned: LMA  Additional Equipment: None  Intra-op Plan:   Post-operative Plan:   Informed Consent: I have reviewed the patients History and Physical, chart, labs and discussed the procedure including the risks, benefits and alternatives for the proposed anesthesia with the patient or authorized representative who has indicated his/her understanding and acceptance.     Dental advisory given  Plan Discussed with: CRNA and Anesthesiologist  Anesthesia Plan Comments: ( general, local by surgeon)        Anesthesia  Quick Evaluation

## 2023-08-27 ENCOUNTER — Ambulatory Visit (HOSPITAL_BASED_OUTPATIENT_CLINIC_OR_DEPARTMENT_OTHER): Payer: BC Managed Care – PPO | Admitting: Anesthesiology

## 2023-08-27 ENCOUNTER — Other Ambulatory Visit: Payer: Self-pay

## 2023-08-27 ENCOUNTER — Ambulatory Visit (HOSPITAL_BASED_OUTPATIENT_CLINIC_OR_DEPARTMENT_OTHER): Payer: BC Managed Care – PPO

## 2023-08-27 ENCOUNTER — Encounter (HOSPITAL_BASED_OUTPATIENT_CLINIC_OR_DEPARTMENT_OTHER)
Admission: RE | Disposition: A | Discharge status: Home / Self Care | Payer: Self-pay | Source: Home / Self Care | Attending: Orthopedic Surgery

## 2023-08-27 ENCOUNTER — Ambulatory Visit (HOSPITAL_BASED_OUTPATIENT_CLINIC_OR_DEPARTMENT_OTHER)
Admission: RE | Admit: 2023-08-27 | Discharge: 2023-08-27 | Disposition: A | Discharge status: Home / Self Care | Payer: BC Managed Care – PPO | Attending: Orthopedic Surgery | Admitting: Orthopedic Surgery

## 2023-08-27 ENCOUNTER — Encounter (HOSPITAL_BASED_OUTPATIENT_CLINIC_OR_DEPARTMENT_OTHER): Payer: Self-pay | Admitting: Orthopedic Surgery

## 2023-08-27 DIAGNOSIS — I10 Essential (primary) hypertension: Secondary | ICD-10-CM | POA: Diagnosis not present

## 2023-08-27 DIAGNOSIS — Z969 Presence of functional implant, unspecified: Secondary | ICD-10-CM

## 2023-08-27 DIAGNOSIS — Y831 Surgical operation with implant of artificial internal device as the cause of abnormal reaction of the patient, or of later complication, without mention of misadventure at the time of the procedure: Secondary | ICD-10-CM | POA: Diagnosis not present

## 2023-08-27 DIAGNOSIS — Z6830 Body mass index (BMI) 30.0-30.9, adult: Secondary | ICD-10-CM | POA: Diagnosis not present

## 2023-08-27 DIAGNOSIS — Z01818 Encounter for other preprocedural examination: Secondary | ICD-10-CM

## 2023-08-27 DIAGNOSIS — T8484XA Pain due to internal orthopedic prosthetic devices, implants and grafts, initial encounter: Secondary | ICD-10-CM | POA: Insufficient documentation

## 2023-08-27 DIAGNOSIS — K219 Gastro-esophageal reflux disease without esophagitis: Secondary | ICD-10-CM | POA: Diagnosis not present

## 2023-08-27 HISTORY — DX: Other specified postprocedural states: Z98.890

## 2023-08-27 HISTORY — DX: Essential (primary) hypertension: I10

## 2023-08-27 HISTORY — PX: HARDWARE REMOVAL: SHX979

## 2023-08-27 SURGERY — REMOVAL, HARDWARE
Anesthesia: General | Site: Foot | Laterality: Left

## 2023-08-27 MED ORDER — BUPIVACAINE LIPOSOME 1.3 % IJ SUSP
INTRAMUSCULAR | Status: DC | PRN
  Administered 2023-08-27: 20 mL

## 2023-08-27 MED ORDER — ACETAMINOPHEN 500 MG PO TABS
1000.0000 mg | ORAL_TABLET | Freq: Once | ORAL | Status: AC
Start: 2023-08-27 — End: 2023-08-27
  Administered 2023-08-27: 1000 mg via ORAL

## 2023-08-27 MED ORDER — SENNA 8.6 MG PO TABS
2.0000 | ORAL_TABLET | Freq: Two times a day (BID) | ORAL | 0 refills | Status: DC
Start: 2023-08-27 — End: 2024-01-25

## 2023-08-27 MED ORDER — DEXAMETHASONE SODIUM PHOSPHATE 10 MG/ML IJ SOLN
INTRAMUSCULAR | Status: DC | PRN
  Administered 2023-08-27: 10 mg via INTRAVENOUS

## 2023-08-27 MED ORDER — LIDOCAINE 2% (20 MG/ML) 5 ML SYRINGE
INTRAMUSCULAR | Status: AC
  Filled 2023-08-27: qty 5

## 2023-08-27 MED ORDER — MIDAZOLAM HCL 2 MG/2ML IJ SOLN
INTRAMUSCULAR | Status: AC
  Filled 2023-08-27: qty 2

## 2023-08-27 MED ORDER — DOCUSATE SODIUM 100 MG PO CAPS
100.0000 mg | ORAL_CAPSULE | Freq: Two times a day (BID) | ORAL | 0 refills | Status: DC
Start: 2023-08-27 — End: 2024-01-25

## 2023-08-27 MED ORDER — SCOPOLAMINE 1 MG/3DAYS TD PT72
1.0000 | MEDICATED_PATCH | TRANSDERMAL | Status: DC
  Administered 2023-08-27: 1.5 mg via TRANSDERMAL

## 2023-08-27 MED ORDER — ACETAMINOPHEN 500 MG PO TABS
ORAL_TABLET | ORAL | Status: AC
  Filled 2023-08-27: qty 2

## 2023-08-27 MED ORDER — PROPOFOL 500 MG/50ML IV EMUL
INTRAVENOUS | Status: DC | PRN
  Administered 2023-08-27: 150 ug/kg/min via INTRAVENOUS

## 2023-08-27 MED ORDER — OXYCODONE HCL 5 MG PO TABS
5.0000 mg | ORAL_TABLET | Freq: Once | ORAL | Status: AC
  Administered 2023-08-27: 5 mg via ORAL

## 2023-08-27 MED ORDER — MIDAZOLAM HCL 5 MG/5ML IJ SOLN
INTRAMUSCULAR | Status: DC | PRN
  Administered 2023-08-27: 2 mg via INTRAVENOUS

## 2023-08-27 MED ORDER — ONDANSETRON HCL 4 MG/2ML IJ SOLN
INTRAMUSCULAR | Status: AC
  Filled 2023-08-27: qty 2

## 2023-08-27 MED ORDER — LIDOCAINE 2% (20 MG/ML) 5 ML SYRINGE
INTRAMUSCULAR | Status: DC | PRN
  Administered 2023-08-27: 60 mg via INTRAVENOUS

## 2023-08-27 MED ORDER — TRAMADOL HCL 50 MG PO TABS: 50.0000 mg | ORAL_TABLET | Freq: Four times a day (QID) | ORAL | 0 refills | Status: DC | PRN

## 2023-08-27 MED ORDER — BUPIVACAINE LIPOSOME 1.3 % IJ SUSP
INTRAMUSCULAR | Status: AC
  Filled 2023-08-27: qty 20

## 2023-08-27 MED ORDER — 0.9 % SODIUM CHLORIDE (POUR BTL) OPTIME
TOPICAL | Status: DC | PRN
  Administered 2023-08-27: 200 mL

## 2023-08-27 MED ORDER — ONDANSETRON HCL 4 MG/2ML IJ SOLN
INTRAMUSCULAR | Status: DC | PRN
  Administered 2023-08-27: 4 mg via INTRAVENOUS

## 2023-08-27 MED ORDER — PROPOFOL 10 MG/ML IV BOLUS
INTRAVENOUS | Status: AC
  Filled 2023-08-27: qty 20

## 2023-08-27 MED ORDER — SODIUM CHLORIDE 0.9 % IV SOLN
INTRAVENOUS | Status: DC | PRN
Start: 2023-08-27 — End: 2023-08-27

## 2023-08-27 MED ORDER — CEFAZOLIN SODIUM-DEXTROSE 2-4 GM/100ML-% IV SOLN
2.0000 g | INTRAVENOUS | Status: AC
  Administered 2023-08-27: 2 g via INTRAVENOUS

## 2023-08-27 MED ORDER — SCOPOLAMINE 1 MG/3DAYS TD PT72
MEDICATED_PATCH | TRANSDERMAL | Status: AC
  Filled 2023-08-27: qty 1

## 2023-08-27 MED ORDER — FENTANYL CITRATE (PF) 100 MCG/2ML IJ SOLN
INTRAMUSCULAR | Status: DC | PRN
  Administered 2023-08-27 (×2): 50 ug via INTRAVENOUS

## 2023-08-27 MED ORDER — SODIUM CHLORIDE (PF) 0.9 % IJ SOLN
INTRAMUSCULAR | Status: AC
  Filled 2023-08-27: qty 20

## 2023-08-27 MED ORDER — PROPOFOL 10 MG/ML IV BOLUS
INTRAVENOUS | Status: DC | PRN
  Administered 2023-08-27: 150 mg via INTRAVENOUS
  Administered 2023-08-27: 20 mg via INTRAVENOUS

## 2023-08-27 MED ORDER — OXYCODONE HCL 5 MG PO TABS
ORAL_TABLET | ORAL | Status: AC
  Filled 2023-08-27: qty 1

## 2023-08-27 MED ORDER — FENTANYL CITRATE (PF) 100 MCG/2ML IJ SOLN
INTRAMUSCULAR | Status: AC
  Filled 2023-08-27: qty 2

## 2023-08-27 MED ORDER — DEXAMETHASONE SODIUM PHOSPHATE 10 MG/ML IJ SOLN
INTRAMUSCULAR | Status: AC
  Filled 2023-08-27: qty 1

## 2023-08-27 MED ORDER — LACTATED RINGERS IV SOLN: INTRAVENOUS | Status: DC

## 2023-08-27 MED ORDER — CEFAZOLIN SODIUM-DEXTROSE 2-4 GM/100ML-% IV SOLN
INTRAVENOUS | Status: AC
  Filled 2023-08-27: qty 100

## 2023-08-27 MED ORDER — VANCOMYCIN HCL 500 MG IV SOLR
INTRAVENOUS | Status: DC | PRN
  Administered 2023-08-27: 500 mg via TOPICAL

## 2023-08-27 SURGICAL SUPPLY — 64 items
APL PRP STRL LF DISP 70% ISPRP (MISCELLANEOUS) ×1
APL SKNCLS STERI-STRIP NONHPOA (GAUZE/BANDAGES/DRESSINGS)
BANDAGE ESMARK 6X9 LF (GAUZE/BANDAGES/DRESSINGS) IMPLANT
BENZOIN TINCTURE PRP APPL 2/3 (GAUZE/BANDAGES/DRESSINGS) IMPLANT
BLADE SURG 15 STRL LF DISP TIS (BLADE) ×2 IMPLANT
BLADE SURG 15 STRL SS (BLADE) ×2
BNDG CMPR 5X4 KNIT ELC UNQ LF (GAUZE/BANDAGES/DRESSINGS)
BNDG CMPR 6 X 5 YARDS HK CLSR (GAUZE/BANDAGES/DRESSINGS)
BNDG CMPR 9X4 STRL LF SNTH (GAUZE/BANDAGES/DRESSINGS)
BNDG CMPR 9X6 STRL LF SNTH (GAUZE/BANDAGES/DRESSINGS)
BNDG ELASTIC 4INX 5YD STR LF (GAUZE/BANDAGES/DRESSINGS) IMPLANT
BNDG ELASTIC 6INX 5YD STR LF (GAUZE/BANDAGES/DRESSINGS) IMPLANT
BNDG ESMARK 4X9 LF (GAUZE/BANDAGES/DRESSINGS) IMPLANT
BNDG ESMARK 6X9 LF (GAUZE/BANDAGES/DRESSINGS)
CHLORAPREP W/TINT 26 (MISCELLANEOUS) ×1 IMPLANT
COVER BACK TABLE 60X90IN (DRAPES) ×1 IMPLANT
CUFF TOURN SGL QUICK 34 (TOURNIQUET CUFF)
CUFF TRNQT CYL 34X4.125X (TOURNIQUET CUFF) IMPLANT
DRAPE EXTREMITY T 121X128X90 (DISPOSABLE) ×1 IMPLANT
DRAPE OEC MINIVIEW 54X84 (DRAPES) IMPLANT
DRAPE SURG 17X23 STRL (DRAPES) IMPLANT
DRAPE U-SHAPE 47X51 STRL (DRAPES) ×1 IMPLANT
DRIVER STRM ST T10 (ORTHOPEDIC DISPOSABLE SUPPLIES) IMPLANT
DRSG MEPITEL 4X7.2 (GAUZE/BANDAGES/DRESSINGS) ×1 IMPLANT
ELECT REM PT RETURN 9FT ADLT (ELECTROSURGICAL) ×1
ELECTRODE REM PT RTRN 9FT ADLT (ELECTROSURGICAL) ×1 IMPLANT
GAUZE PAD ABD 8X10 STRL (GAUZE/BANDAGES/DRESSINGS) IMPLANT
GAUZE SPONGE 4X4 12PLY STRL (GAUZE/BANDAGES/DRESSINGS) ×1 IMPLANT
GLOVE BIO SURGEON STRL SZ8 (GLOVE) ×1 IMPLANT
GLOVE BIOGEL PI IND STRL 8 (GLOVE) ×2 IMPLANT
GLOVE ECLIPSE 8.0 STRL XLNG CF (GLOVE) ×1 IMPLANT
GOWN STRL REUS W/ TWL LRG LVL3 (GOWN DISPOSABLE) ×1 IMPLANT
GOWN STRL REUS W/ TWL XL LVL3 (GOWN DISPOSABLE) ×2 IMPLANT
GOWN STRL REUS W/TWL LRG LVL3 (GOWN DISPOSABLE) ×1
GOWN STRL REUS W/TWL XL LVL3 (GOWN DISPOSABLE) ×2
NDL HYPO 22X1.5 SAFETY MO (MISCELLANEOUS) IMPLANT
NEEDLE HYPO 22X1.5 SAFETY MO (MISCELLANEOUS) IMPLANT
PACK BASIN DAY SURGERY FS (CUSTOM PROCEDURE TRAY) ×1 IMPLANT
PAD CAST 4YDX4 CTTN HI CHSV (CAST SUPPLIES) ×1 IMPLANT
PADDING CAST COTTON 4X4 STRL (CAST SUPPLIES) ×1
PADDING CAST COTTON 6X4 STRL (CAST SUPPLIES) IMPLANT
PENCIL SMOKE EVACUATOR (MISCELLANEOUS) ×1 IMPLANT
SANITIZER HAND PURELL FF 515ML (MISCELLANEOUS) ×1 IMPLANT
SHEET MEDIUM DRAPE 40X70 STRL (DRAPES) ×1 IMPLANT
SLEEVE SCD COMPRESS KNEE MED (STOCKING) ×1 IMPLANT
SPIKE FLUID TRANSFER (MISCELLANEOUS) IMPLANT
SPLINT PLASTER CAST FAST 5X30 (CAST SUPPLIES) IMPLANT
SPONGE T-LAP 18X18 ~~LOC~~+RFID (SPONGE) ×1 IMPLANT
STOCKINETTE 6 STRL (DRAPES) ×1 IMPLANT
STRIP CLOSURE SKIN 1/2X4 (GAUZE/BANDAGES/DRESSINGS) IMPLANT
SUCTION TUBE FRAZIER 10FR DISP (SUCTIONS) IMPLANT
SUT BONE WAX W31G (SUTURE) IMPLANT
SUT ETHILON 3 0 PS 1 (SUTURE) IMPLANT
SUT MNCRL AB 3-0 PS2 18 (SUTURE) ×1 IMPLANT
SUT VIC AB 2-0 SH 18 (SUTURE) IMPLANT
SUT VIC AB 2-0 SH 27 (SUTURE)
SUT VIC AB 2-0 SH 27XBRD (SUTURE) IMPLANT
SUT VICRYL 0 SH 27 (SUTURE) IMPLANT
SYR BULB EAR ULCER 3OZ GRN STR (SYRINGE) ×1 IMPLANT
SYR CONTROL 10ML LL (SYRINGE) IMPLANT
TOWEL GREEN STERILE FF (TOWEL DISPOSABLE) ×1 IMPLANT
TUBE CONNECTING 20X1/4 (TUBING) IMPLANT
UNDERPAD 30X36 HEAVY ABSORB (UNDERPADS AND DIAPERS) ×1 IMPLANT
YANKAUER SUCT BULB TIP NO VENT (SUCTIONS) IMPLANT

## 2023-08-27 NOTE — Op Note (Signed)
08/27/2023  11:38 AM  PATIENT:  Angela Osborn  52 y.o. female  PRE-OPERATIVE DIAGNOSIS: Painful hardware left medial and middle cuneiforms, first and second metatarsal status post ORIF of a Lisfranc injury  POST-OPERATIVE DIAGNOSIS: Same  Procedure(s): 1.  Removal of deep implants left first metatarsal and medial cuneiform through a dorsal incision 2.  Removal of deep implants left second metatarsal and middle cuneiform through a medial incision 3.  Left foot AP and lateral radiographs  SURGEON:  Toni Arthurs, MD  ASSISTANT: Alfredo Martinez, PA-C  ANESTHESIA:   General, local  EBL:  minimal   TOURNIQUET:   Total Tourniquet Time Documented: Calf (Left) - 31 minutes Total: Calf (Left) - 31 minutes  COMPLICATIONS:  None apparent  DISPOSITION:  Extubated, awake and stable to recovery.  INDICATION FOR PROCEDURE: The patient is now many months out from open treatment of a left foot Lisfranc injury.  She has painful hardware at the medial and middle cuneiforms and first and second metatarsals.  She has failed nonoperative treatment to date and presents today for removal of the painful deep implants.  The risks and benefits of the alternative treatment options have been discussed in detail.  The patient wishes to proceed with surgery and specifically understands risks of bleeding, infection, nerve damage, blood clots, need for additional surgery, amputation and death.    PROCEDURE IN DETAIL:  After pre operative consent was obtained, and the correct operative site was identified, the patient was brought to the operating room and placed supine on the OR table.  Anesthesia was administered.  Pre-operative antibiotics were administered.  A surgical timeout was taken.  The left lower extremity was prepped and draped in standard sterile fashion with a tourniquet around the thigh.  The extremity was elevated, and the tourniquet was inflated to 250 mmHg.  The previous dorsal and medial incisions  were identified.  The medial incision was opened again sharply.  Dissection was carried down through the subcutaneous tissues.  The more proximal screw head was identified.  The screw was removed without difficulty.  The more distal screw head was then identified.  A K wire was inserted into the screw head.  The cannulated screwdriver was used to remove the screw without difficulty.  Attention was turned to the dorsal incision.  It was opened again sharply and dissection carried down through the subcutaneous tissues.  The EHL tendon was protected.  The dorsal aspect of the plate was identified.  Dissection was carried proximally and distally exposing the entire plate.  All 3 screws were removed.  The plate was elevated with an osteotome and removed as well.  Both wounds were irrigated copiously.  AP and lateral radiographs confirmed removal of all radiopaque hardware.  Vancomycin powder was sprinkled in the wounds.  Skin incisions were closed with horizontal mattress sutures of 3-0 nylon.  The subcutaneous tissues around both incisions were anesthetized with Exparel.  Sterile dressings were applied followed by compression wrap.  The tourniquet was released after application of the dressings.  The patient was awakened from anesthesia and transported to the recovery room in stable condition.  FOLLOW UP PLAN: Weightbearing as tolerated on the left foot.  Cam boot as needed for pain.  Follow-up in the office in 2 weeks for suture removal.  No indication for DVT prophylaxis in this ambulatory patient.   RADIOGRAPHS: AP and lateral radiographs of the left foot are obtained intraoperatively.  These show interval removal of hardware from the medial and middle  cuneiforms and the first and second metatarsals.  The Lisfranc joint complex is appropriately reduced.  No other acute injuries are noted.    Alfredo Martinez PA-C was present and scrubbed for the duration of the operative case. His assistance was essential in  positioning the patient, prepping and draping, gaining and maintaining exposure, performing the operation, closing and dressing the wounds and applying the splint.

## 2023-08-27 NOTE — Transfer of Care (Signed)
Immediate Anesthesia Transfer of Care Note  Patient: Angela Osborn  Procedure(s) Performed: HARDWARE REMOVAL LEFT FOOT 1ST METATARSAL, MEDIAL AND MIDDLE CUNEIFORMS (Left: Foot)  Patient Location: PACU  Anesthesia Type:General  Level of Consciousness: awake, alert , and oriented  Airway & Oxygen Therapy: Patient Spontanous Breathing and Patient connected to face mask oxygen  Post-op Assessment: Report given to RN and Post -op Vital signs reviewed and stable  Post vital signs: Reviewed and stable  Last Vitals:  Vitals Value Taken Time  BP 130/77 08/27/23 1141  Temp    Pulse 72 08/27/23 1145  Resp 13 08/27/23 1145  SpO2 100 % 08/27/23 1145  Vitals shown include unfiled device data.  Last Pain:  Vitals:   08/27/23 0922  TempSrc: Oral  PainSc: 0-No pain         Complications: No notable events documented.

## 2023-08-27 NOTE — Discharge Instructions (Addendum)
Toni Arthurs, MD [facility]  Please read the following information regarding your care after surgery.  Medications  You only need a prescription for the narcotic pain medicine (ex. oxycodone, Percocet, Norco).  All of the other medicines listed below are available over the counter. ? Aleve 2 pills twice a day for the first 3 days after surgery. ? acetominophen (Tylenol) 650 mg every 4-6 hours as you need for minor to moderate pain ? tramadol as prescribed for severe pain  Narcotic pain medicine (ex. oxycodone, Percocet, Vicodin) will cause constipation.  To prevent this problem, take the following medicines while you are taking any pain medicine. ? docusate sodium (Colace) 100 mg twice a day ? senna (Senokot) 2 tablets twice a day   Weight Bearing ? Bear weight when you are able on your operated leg or foot. CAM boot as needed.   Cast / Splint / Dressing ? Keep your splint, cast or dressing clean and dry.  Don't put anything (coat hanger, pencil, etc) down inside of it.  If it gets damp, use a hair dryer on the cool setting to dry it.  If it gets soaked, call the office to schedule an appointment for a cast change.   After your dressing, cast or splint is removed; you may shower, but do not soak or scrub the wound.  Allow the water to run over it, and then gently pat it dry.  Swelling It is normal for you to have swelling where you had surgery.  To reduce swelling and pain, keep your toes above your nose for at least 3 days after surgery.  It may be necessary to keep your foot or leg elevated for several weeks.  If it hurts, it should be elevated.  Follow Up Call my office at [phone] when you are discharged from the hospital or surgery center to schedule an appointment to be seen two weeks after surgery.  Call my office at [phone] if you develop a fever >101.5 F, nausea, vomiting, bleeding from the surgical site or severe pain.    May take Tylenol after 3:30pm, if needed.     Post Anesthesia Home Care Instructions  Activity: Get plenty of rest for the remainder of the day. A responsible individual must stay with you for 24 hours following the procedure.  For the next 24 hours, DO NOT: -Drive a car -Advertising copywriter -Drink alcoholic beverages -Take any medication unless instructed by your physician -Make any legal decisions or sign important papers.  Meals: Start with liquid foods such as gelatin or soup. Progress to regular foods as tolerated. Avoid greasy, spicy, heavy foods. If nausea and/or vomiting occur, drink only clear liquids until the nausea and/or vomiting subsides. Call your physician if vomiting continues.  Special Instructions/Symptoms: Your throat may feel dry or sore from the anesthesia or the breathing tube placed in your throat during surgery. If this causes discomfort, gargle with warm salt water. The discomfort should disappear within 24 hours.  If you had a scopolamine patch placed behind your ear for the management of post- operative nausea and/or vomiting:  1. The medication in the patch is effective for 72 hours, after which it should be removed.  Wrap patch in a tissue and discard in the trash. Wash hands thoroughly with soap and water. 2. You may remove the patch earlier than 72 hours if you experience unpleasant side effects which may include dry mouth, dizziness or visual disturbances. 3. Avoid touching the patch. Wash your hands with soap  and water after contact with the patch.    Information for Discharge Teaching: EXPAREL (bupivacaine liposome injectable suspension)   Pain relief is important to your recovery. The goal is to control your pain so you can move easier and return to your normal activities as soon as possible after your procedure. Your physician may use several types of medicines to manage pain, swelling, and more.  Your surgeon or anesthesiologist gave you EXPAREL(bupivacaine) to help control your pain after  surgery.  EXPAREL is a local anesthetic designed to release slowly over an extended period of time to provide pain relief by numbing the tissue around the surgical site. EXPAREL is designed to release pain medication over time and can control pain for up to 72 hours. Depending on how you respond to EXPAREL, you may require less pain medication during your recovery. EXPAREL can help reduce or eliminate the need for opioids during the first few days after surgery when pain relief is needed the most. EXPAREL is not an opioid and is not addictive. It does not cause sleepiness or sedation.   Important! A teal colored band has been placed on your arm with the date, time and amount of EXPAREL you have received. Please leave this armband in place for the full 96 hours following administration, and then you may remove the band. If you return to the hospital for any reason within 96 hours following the administration of EXPAREL, the armband provides important information that your health care providers to know, and alerts them that you have received this anesthetic.    Possible side effects of EXPAREL: Temporary loss of sensation or ability to move in the area where medication was injected. Nausea, vomiting, constipation Rarely, numbness and tingling in your mouth or lips, lightheadedness, or anxiety may occur. Call your doctor right away if you think you may be experiencing any of these sensations, or if you have other questions regarding possible side effects.  Follow all other discharge instructions given to you by your surgeon or nurse. Eat a healthy diet and drink plenty of water or other fluids.

## 2023-08-27 NOTE — H&P (Signed)
Angela Osborn is an 52 y.o. female.   Chief Complaint: Left foot pain HPI: 52 year old female is now many months out from open reduction and internal fixation of a left foot Lisfranc injury.  She still has some pain at the hardware at the midfoot.  She has failed nonoperative treatment to date including activity modification, oral anti-inflammatories and shoewear modification.  She presents for removal of the deep implants from her medial and middle cuneiforms and first and second metatarsals.  Past Medical History:  Diagnosis Date   GERD (gastroesophageal reflux disease)    Hypertension    PONV (postoperative nausea and vomiting)     Past Surgical History:  Procedure Laterality Date   BACK SURGERY  1998   L 4 or 5   bone spur     OPEN REDUCTION INTERNAL FIXATION (ORIF) DISTAL RADIAL FRACTURE Left 03/06/2020   Procedure: left volar Barton's fracture open reduction internal fixation comminuted complex distal radius fracture with DVR Biomet cross lock plate and screw    AP lateral and oblique x-rays performed;  Surgeon: Dominica Severin, MD;  Location: [facility] OR;  Service: Orthopedics;  Laterality: Left;  1 hr Block with IV Sed    Family History  Problem Relation Age of Onset   Breast cancer Maternal Aunt 77   Breast cancer Maternal Aunt 12   Social History:  reports that she has never smoked. She has never used smokeless tobacco. She reports that she does not drink alcohol and does not use drugs.  Allergies:  Allergies  Allergen Reactions   Ciprofloxacin Nausea Only   Sulfa Antibiotics Nausea Only    Medications Prior to Admission  Medication Sig Dispense Refill   Ascorbic Acid (VITAMIN C) 1000 MG tablet Take 1,000 mg by mouth daily.     CALCIUM-MAGNESIUM-ZINC PO Take 1 tablet by mouth daily.      cholecalciferol (VITAMIN D3) 25 MCG (1000 UNIT) tablet Take 1,000 Units by mouth daily.     losartan (COZAAR) 100 MG tablet Take 100 mg by mouth daily.     Multiple Vitamin  (MULTI-VITAMINS) TABS Take 1 tablet by mouth daily.      omeprazole (PRILOSEC) 40 MG capsule Take by mouth.     Turmeric 500 MG CAPS Take 1 capsule by mouth daily.      cyclobenzaprine (FLEXERIL) 5 MG tablet Take 1-2 tablets (5-10 mg total) by mouth 3 (three) times daily as needed for muscle spasms. 20 tablet 0    No results found for this or any previous visit (from the past 48 hour(s)). No results found.  Review of Systems no recent fever, chills, nausea, vomiting or changes in her appetite  Blood pressure (!) 135/94, pulse 86, temperature 98.1 F (36.7 C), temperature source Oral, resp. rate 15, height 5\' 7"  (1.702 m), weight 88.4 kg, last menstrual period 08/06/2019, SpO2 100%. Physical Exam  Well-nourished well-developed woman in no apparent distress.  Alert and oriented.  Normal mood and affect.  Normal gait.  The left foot has healed surgical scars.  Minimal swelling.  Tender to palpation over the hardware dorsally and medially.  Intact sensibility to light touch dorsally and plantarly at the forefoot.  Active plantarflexion and dorsiflexion strength at the ankle and toes.   Assessment/Plan Painful hardware left medial and middle cuneiforms, first and second metatarsals status post ORIF of the Lisfranc injury -to the operating room today for removal of the painful hardware.  The risks and benefits of the alternative treatment options have been discussed  in detail.  The patient wishes to proceed with surgery and specifically understands risks of bleeding, infection, nerve damage, blood clots, need for additional surgery, amputation and death.   Toni Arthurs, MD 2023/08/29, 10:20 AM

## 2023-08-27 NOTE — Anesthesia Procedure Notes (Addendum)
Procedure Name: LMA Insertion Date/Time: 08/27/2023 10:53 AM  Performed by: Burna Cash, CRNAPre-anesthesia Checklist: Patient identified, Emergency Drugs available, Suction available and Patient being monitored Patient Re-evaluated:Patient Re-evaluated prior to induction Oxygen Delivery Method: Circle system utilized Preoxygenation: Pre-oxygenation with 100% oxygen Induction Type: IV induction Ventilation: Mask ventilation without difficulty LMA: LMA inserted LMA Size: 4.0 Number of attempts: 1 Airway Equipment and Method: Bite block Placement Confirmation: positive ETCO2 Tube secured with: Tape Dental Injury: Teeth and Oropharynx as per pre-operative assessment

## 2023-08-27 NOTE — Anesthesia Postprocedure Evaluation (Signed)
Anesthesia Post Note  Patient: Angela Osborn  Procedure(s) Performed: HARDWARE REMOVAL LEFT FOOT 1ST METATARSAL, MEDIAL AND MIDDLE CUNEIFORMS (Left: Foot)     Patient location during evaluation: PACU Anesthesia Type: General Level of consciousness: awake and alert Pain management: pain level controlled Vital Signs Assessment: post-procedure vital signs reviewed and stable Respiratory status: spontaneous breathing, nonlabored ventilation, respiratory function stable and patient connected to nasal cannula oxygen Cardiovascular status: blood pressure returned to baseline and stable Postop Assessment: no apparent nausea or vomiting Anesthetic complications: no   No notable events documented.  Last Vitals:  Vitals:   08/27/23 1200 08/27/23 1211  BP:  (!) 151/90  Pulse: 81 79  Resp: 19 14  Temp:  (!) 35.6 C  SpO2: 97% 100%    Last Pain:  Vitals:   08/27/23 1211  TempSrc: Tympanic  PainSc: 3                  Nyko Gell

## 2023-08-27 NOTE — Progress Notes (Signed)
Phase II now

## 2023-08-28 ENCOUNTER — Encounter (HOSPITAL_BASED_OUTPATIENT_CLINIC_OR_DEPARTMENT_OTHER): Payer: Self-pay | Admitting: Orthopedic Surgery

## 2024-01-22 ENCOUNTER — Other Ambulatory Visit: Payer: Self-pay | Admitting: Obstetrics and Gynecology

## 2024-01-22 DIAGNOSIS — Z0000 Encounter for general adult medical examination without abnormal findings: Secondary | ICD-10-CM

## 2024-01-25 ENCOUNTER — Ambulatory Visit

## 2024-01-25 ENCOUNTER — Ambulatory Visit: Admitting: Podiatry

## 2024-01-25 ENCOUNTER — Encounter: Payer: Self-pay | Admitting: Podiatry

## 2024-01-25 DIAGNOSIS — D2372 Other benign neoplasm of skin of left lower limb, including hip: Secondary | ICD-10-CM | POA: Diagnosis not present

## 2024-01-25 DIAGNOSIS — M7752 Other enthesopathy of left foot: Secondary | ICD-10-CM | POA: Diagnosis not present

## 2024-01-25 MED ORDER — DEXAMETHASONE SODIUM PHOSPHATE 120 MG/30ML IJ SOLN
2.0000 mg | Freq: Once | INTRAMUSCULAR | Status: AC
  Administered 2024-01-25: 2 mg via INTRA_ARTICULAR
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
## 2024-02-15 ENCOUNTER — Other Ambulatory Visit

## 2024-02-16 ENCOUNTER — Ambulatory Visit

## 2024-02-16 NOTE — Progress Notes (Signed)
 Orthotics   Patient was present and evaluated for Custom molded foot orthotics. Patient will benefit from CFO's to provide total contact to BIL MLA's helping to balance and distribute body weight more evenly across BIL feet helping to reduce plantar pressure and pain. Orthotic will also encourage FF / RF alignment  Patient was scanned today and will return for fitting upon receipt

## 2024-03-10 ENCOUNTER — Ambulatory Visit (INDEPENDENT_AMBULATORY_CARE_PROVIDER_SITE_OTHER)

## 2024-03-10 DIAGNOSIS — M2142 Flat foot [pes planus] (acquired), left foot: Secondary | ICD-10-CM | POA: Diagnosis not present

## 2024-03-10 DIAGNOSIS — M7752 Other enthesopathy of left foot: Secondary | ICD-10-CM

## 2024-03-10 DIAGNOSIS — M2141 Flat foot [pes planus] (acquired), right foot: Secondary | ICD-10-CM

## 2024-03-16 ENCOUNTER — Other Ambulatory Visit

## 2024-03-31 ENCOUNTER — Other Ambulatory Visit

## 2024-04-14 NOTE — Progress Notes (Signed)
 Patient presents today to pick up custom molded foot orthotics, diagnosed with Bursitis by Dr. Verta.   Orthotics were dispensed and fit was satisfactory. Reviewed instructions for break-in and wear. Written instructions given to patient.  Patient will follow up as needed.  Lolita Schultze

## 2024-07-06 ENCOUNTER — Telehealth: Payer: Self-pay

## 2024-07-06 NOTE — Telephone Encounter (Signed)
 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

## 2024-08-02 ENCOUNTER — Ambulatory Visit

## 2024-08-02 DIAGNOSIS — M2142 Flat foot [pes planus] (acquired), left foot: Secondary | ICD-10-CM | POA: Diagnosis not present

## 2024-08-02 DIAGNOSIS — M2141 Flat foot [pes planus] (acquired), right foot: Secondary | ICD-10-CM | POA: Diagnosis not present

## 2024-08-02 DIAGNOSIS — M7752 Other enthesopathy of left foot: Secondary | ICD-10-CM

## 2024-08-02 NOTE — Progress Notes (Signed)
 Patient presents today to pick up custom molded foot orthotics, diagnosed with Pes planus Bursitis by Dr. Verta.   Orthotics were dispensed and fit was satisfactory. Reviewed instructions for break-in and wear. Written instructions given to patient.  Patient will follow up as needed.   Lolita Schultze Cped, CFo, CFm

## 2024-11-16 ENCOUNTER — Telehealth: Payer: Self-pay | Admitting: Podiatrist

## 2024-11-16 NOTE — Telephone Encounter (Signed)
 Patient called in requesting for another order to be placed for the same pair she received in October 2025.
